# Patient Record
Sex: Female | Born: 1945 | Race: White | Hispanic: No | Marital: Married | Smoking: Current every day smoker
Health system: Southern US, Community
[De-identification: ages and names within clinical notes are randomized; demographics above are authoritative.]

## PROBLEM LIST (undated history)

## (undated) DIAGNOSIS — E7800 Pure hypercholesterolemia, unspecified: Secondary | ICD-10-CM

## (undated) DIAGNOSIS — E039 Hypothyroidism, unspecified: Secondary | ICD-10-CM

## (undated) DIAGNOSIS — K5792 Diverticulitis of intestine, part unspecified, without perforation or abscess without bleeding: Secondary | ICD-10-CM

## (undated) HISTORY — PX: HEMORRHOID SURGERY: SHX153

---

## 1997-04-15 ENCOUNTER — Ambulatory Visit (HOSPITAL_COMMUNITY): Admission: RE | Admit: 1997-04-15 | Discharge: 1997-04-15 | Payer: Self-pay | Admitting: Obstetrics and Gynecology

## 1998-04-08 ENCOUNTER — Encounter: Payer: Self-pay | Admitting: Obstetrics and Gynecology

## 1998-04-08 ENCOUNTER — Ambulatory Visit (HOSPITAL_COMMUNITY): Admission: RE | Admit: 1998-04-08 | Discharge: 1998-04-08 | Payer: Self-pay | Admitting: Obstetrics and Gynecology

## 1998-04-27 ENCOUNTER — Other Ambulatory Visit: Admission: RE | Admit: 1998-04-27 | Discharge: 1998-04-27 | Payer: Self-pay | Admitting: Obstetrics and Gynecology

## 1999-04-11 ENCOUNTER — Ambulatory Visit (HOSPITAL_COMMUNITY): Admission: RE | Admit: 1999-04-11 | Discharge: 1999-04-11 | Payer: Self-pay | Admitting: Gynecology

## 1999-04-11 ENCOUNTER — Encounter: Payer: Self-pay | Admitting: Gynecology

## 1999-05-03 ENCOUNTER — Other Ambulatory Visit: Admission: RE | Admit: 1999-05-03 | Discharge: 1999-05-03 | Payer: Self-pay | Admitting: Gynecology

## 2000-04-12 ENCOUNTER — Ambulatory Visit (HOSPITAL_COMMUNITY): Admission: RE | Admit: 2000-04-12 | Discharge: 2000-04-12 | Payer: Self-pay | Admitting: Gynecology

## 2000-04-12 ENCOUNTER — Encounter: Payer: Self-pay | Admitting: Gynecology

## 2000-05-10 ENCOUNTER — Other Ambulatory Visit: Admission: RE | Admit: 2000-05-10 | Discharge: 2000-05-10 | Payer: Self-pay | Admitting: Gynecology

## 2001-04-16 ENCOUNTER — Encounter: Payer: Self-pay | Admitting: Obstetrics and Gynecology

## 2001-04-16 ENCOUNTER — Ambulatory Visit (HOSPITAL_COMMUNITY): Admission: RE | Admit: 2001-04-16 | Discharge: 2001-04-16 | Payer: Self-pay | Admitting: Obstetrics and Gynecology

## 2001-05-06 ENCOUNTER — Other Ambulatory Visit: Admission: RE | Admit: 2001-05-06 | Discharge: 2001-05-06 | Payer: Self-pay | Admitting: Obstetrics and Gynecology

## 2002-04-28 ENCOUNTER — Ambulatory Visit (HOSPITAL_COMMUNITY): Admission: RE | Admit: 2002-04-28 | Discharge: 2002-04-28 | Payer: Self-pay | Admitting: Obstetrics and Gynecology

## 2002-04-28 ENCOUNTER — Encounter: Payer: Self-pay | Admitting: Obstetrics and Gynecology

## 2002-05-27 ENCOUNTER — Other Ambulatory Visit: Admission: RE | Admit: 2002-05-27 | Discharge: 2002-05-27 | Payer: Self-pay | Admitting: Obstetrics and Gynecology

## 2002-08-29 ENCOUNTER — Ambulatory Visit (HOSPITAL_COMMUNITY): Admission: RE | Admit: 2002-08-29 | Discharge: 2002-08-29 | Payer: Self-pay | Admitting: Gastroenterology

## 2003-08-21 ENCOUNTER — Other Ambulatory Visit: Admission: RE | Admit: 2003-08-21 | Discharge: 2003-08-21 | Payer: Self-pay | Admitting: Obstetrics and Gynecology

## 2004-09-14 ENCOUNTER — Other Ambulatory Visit: Admission: RE | Admit: 2004-09-14 | Discharge: 2004-09-14 | Payer: Self-pay | Admitting: Obstetrics and Gynecology

## 2005-03-14 ENCOUNTER — Ambulatory Visit (HOSPITAL_COMMUNITY): Admission: RE | Admit: 2005-03-14 | Discharge: 2005-03-14 | Payer: Self-pay | Admitting: Obstetrics and Gynecology

## 2006-03-15 ENCOUNTER — Ambulatory Visit (HOSPITAL_COMMUNITY): Admission: RE | Admit: 2006-03-15 | Discharge: 2006-03-15 | Payer: Self-pay | Admitting: Obstetrics and Gynecology

## 2006-04-03 ENCOUNTER — Encounter: Admission: RE | Admit: 2006-04-03 | Discharge: 2006-04-03 | Payer: Self-pay | Admitting: Obstetrics and Gynecology

## 2007-03-18 ENCOUNTER — Ambulatory Visit (HOSPITAL_COMMUNITY): Admission: RE | Admit: 2007-03-18 | Discharge: 2007-03-18 | Payer: Self-pay | Admitting: Obstetrics and Gynecology

## 2008-07-20 IMAGING — MG MM DIGITAL SCREENING BILAT
4 series · 4 of 4 positions shown · non-contrast
Comparison: none

DG SCREEN MAMMOGRAM BILATERAL
Bilateral CC and MLO view(s) were taken.

DIGITAL SCREENING MAMMOGRAM WITH CAD:
There is a dense fibroglandular pattern.  A possible mass is noted in the left breast.  Spot 
compression views and possibly sonography are recommended for further evaluation.  In the right 
breast, no masses or malignant type calcifications are identified.  Compared with prior studies.

[R CC]
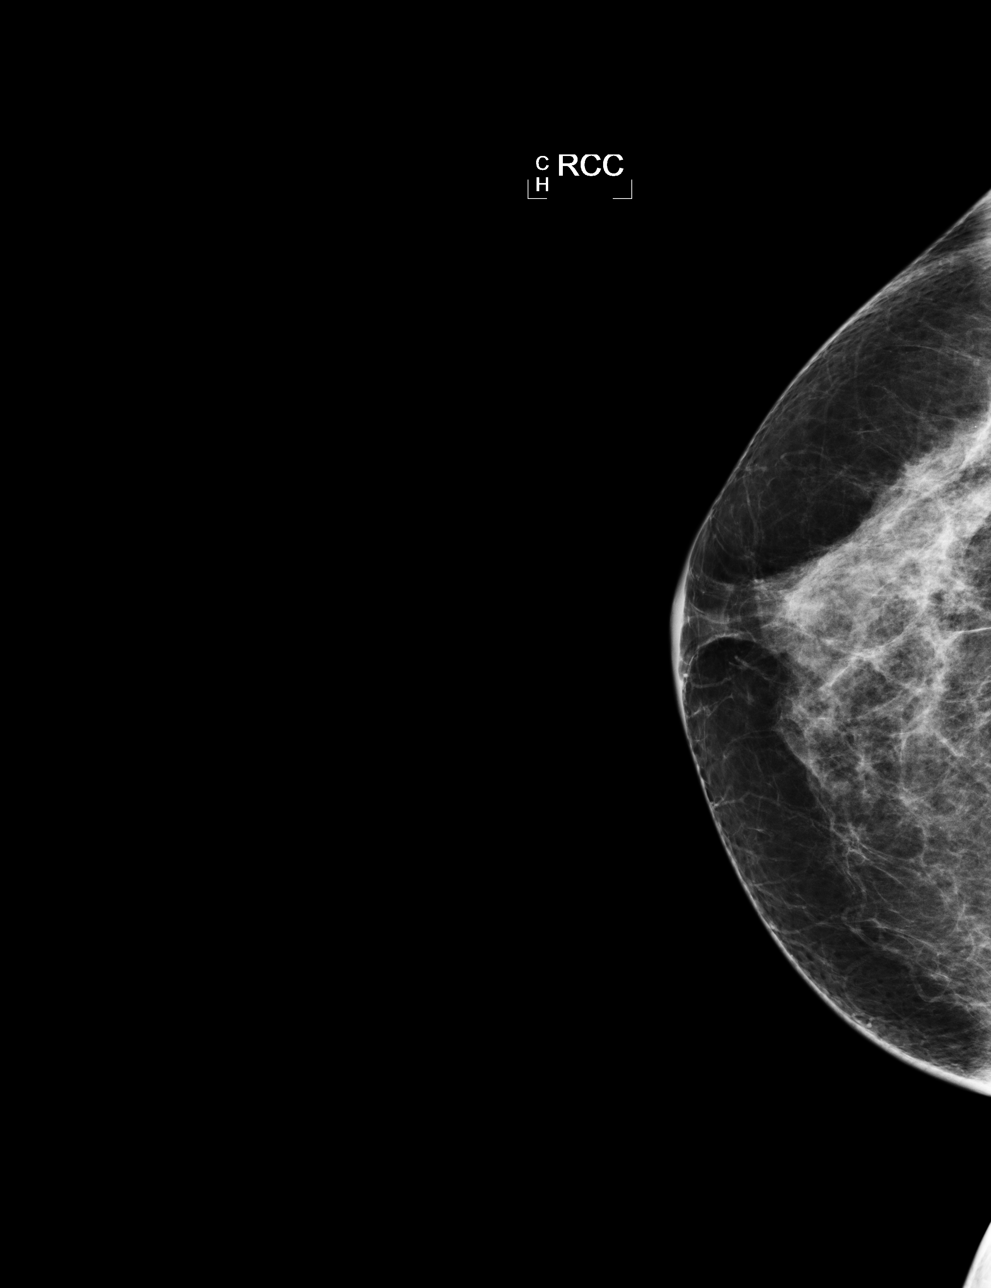

[R MLO]
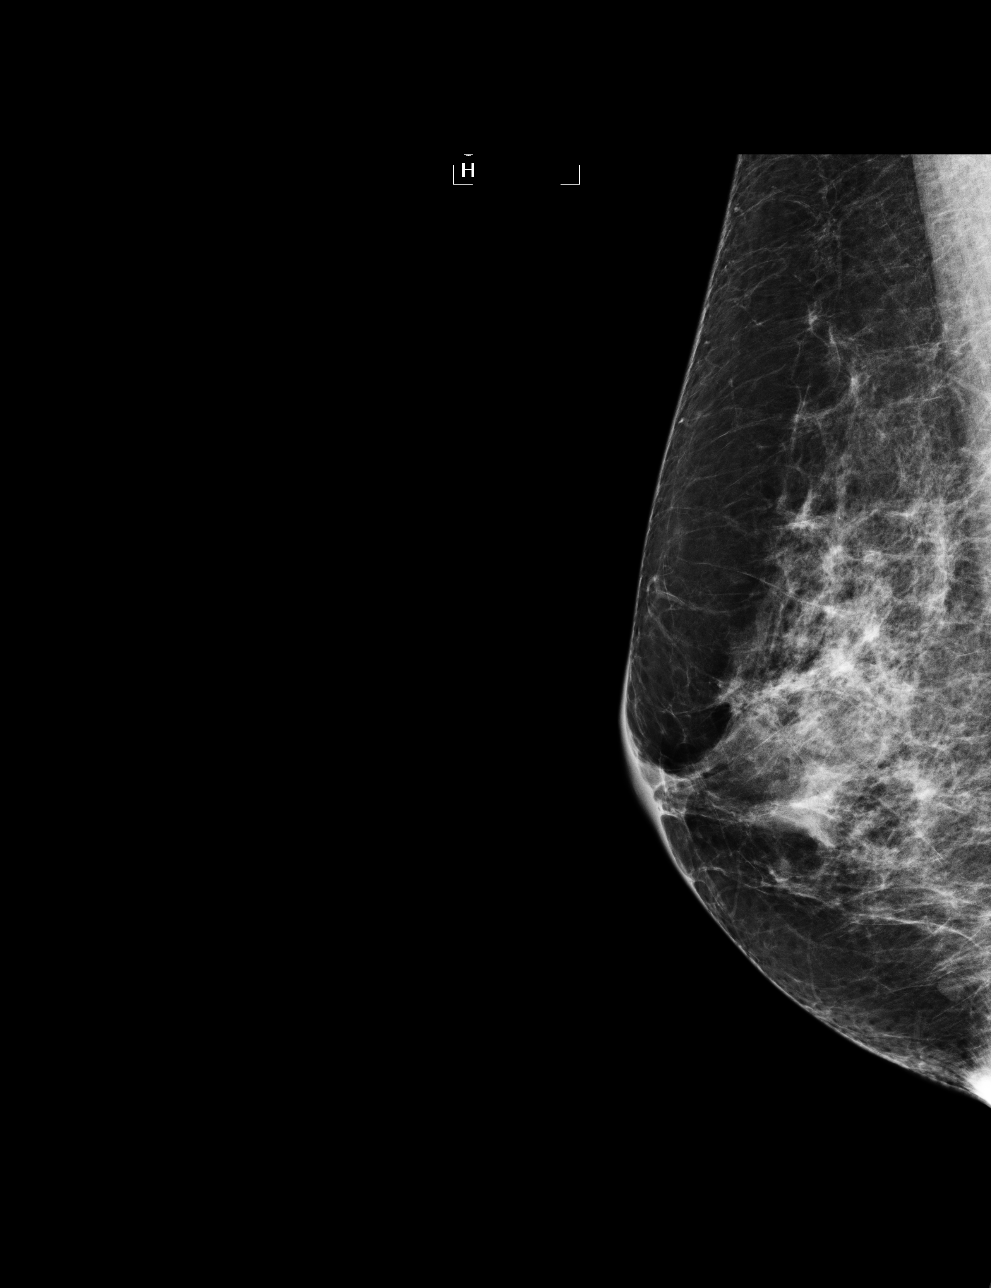

[L CC]
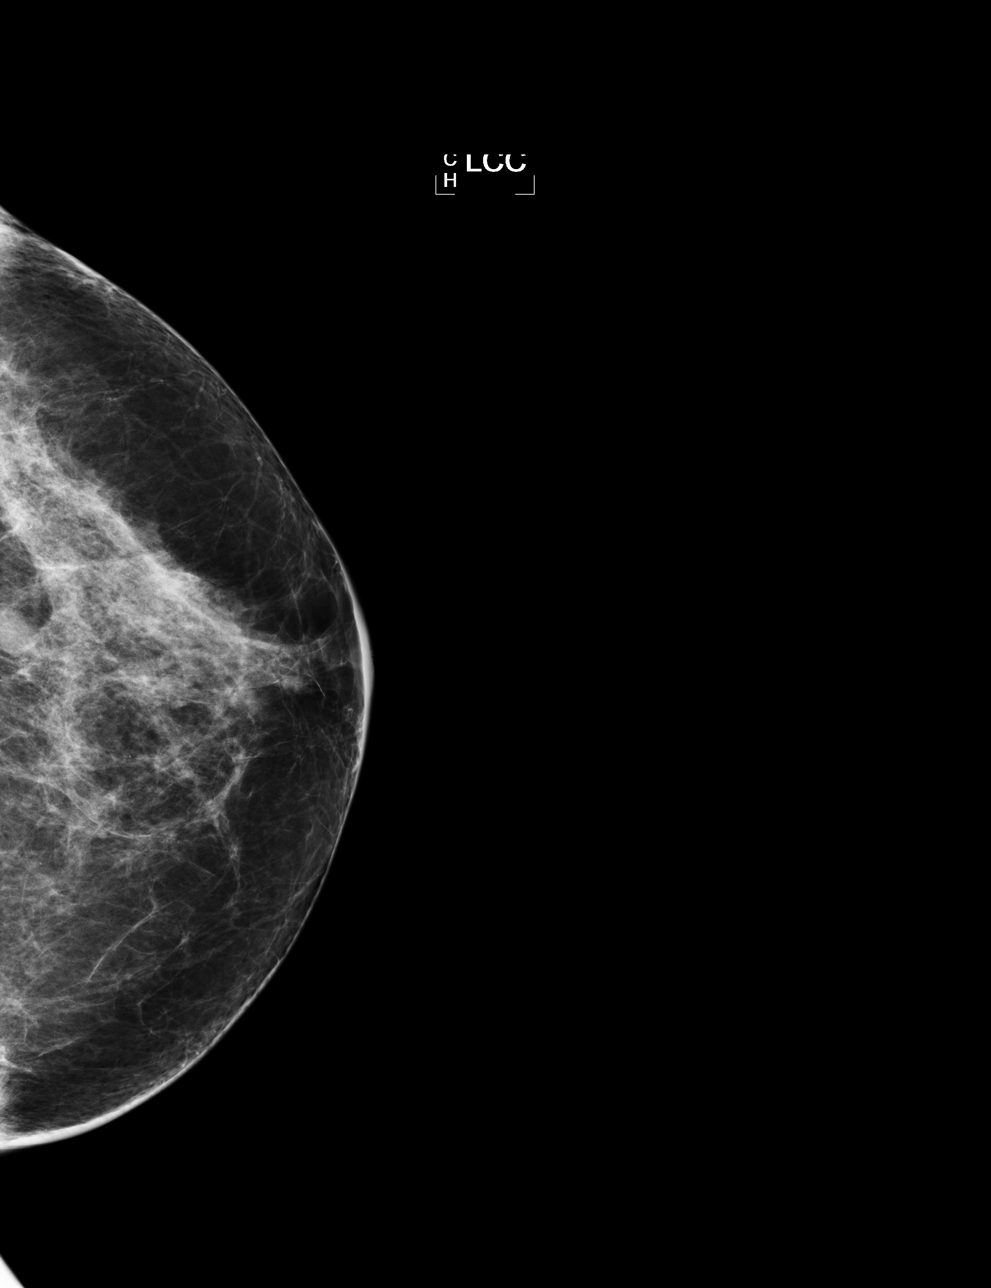

[L MLO]
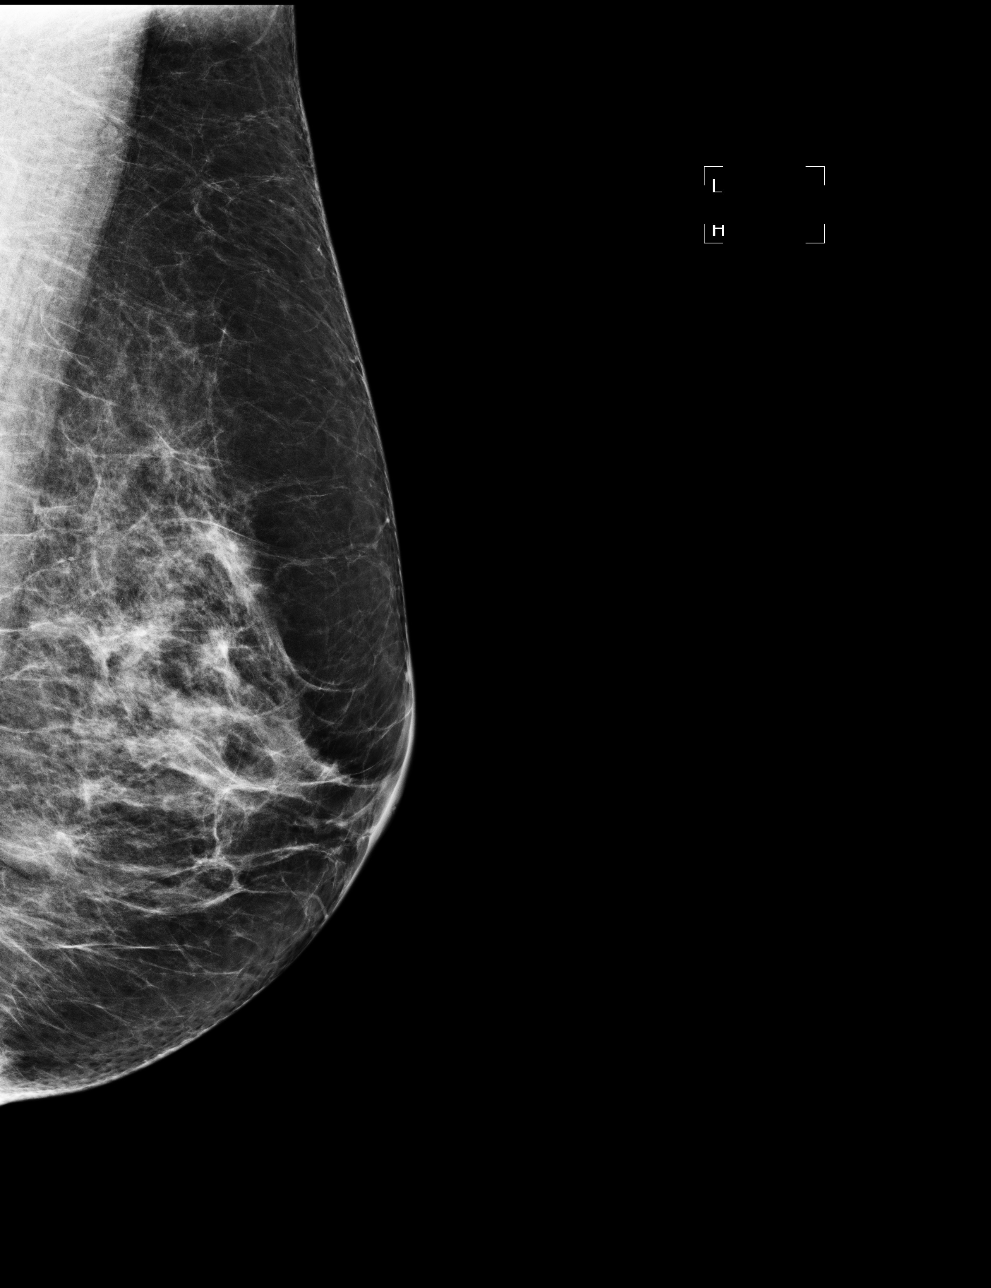

[4 of 4 positions shown; findings below may reference images not displayed]

IMPRESSION: Possible mass, left breast.  Additional evaluation is indicated.  The patient will be contacted for
additional studies and a supplementary report will follow.  No specific mammographic evidence of 
malignancy, right breast.

ASSESSMENT: Need additional imaging evaluation and/or prior mammograms for comparison - BI-RADS 0

Further imaging of the left breast.
ANALYZED BY COMPUTER AIDED DETECTION. , THIS PROCEDURE WAS A DIGITAL MAMMOGRAM.

## 2010-02-14 ENCOUNTER — Encounter: Payer: Self-pay | Admitting: Specialist

## 2010-06-10 NOTE — Op Note (Signed)
NAME:  Patricia Haney, Patricia Haney                    ACCOUNT NO.:  [id number]   MEDICAL RECORD NO.:  [id number]                   PATIENT TYPE:  AMB   LOCATION:  ENDO                                 FACILITY:  [facility]   PHYSICIAN:  Anselmo Rod, M.D.               DATE OF BIRTH:  1945/02/05   DATE OF PROCEDURE:  08/29/2002  DATE OF DISCHARGE:                                 OPERATIVE REPORT   PROCEDURE:  Screening colonoscopy.   ENDOSCOPIST:  Anselmo Rod, M.D.   INSTRUMENT USED:  Olympus video colonoscope.   INDICATIONS FOR PROCEDURE:  A 65 year old white female with a family history  of colon cancer and a recent  history of rectal bleeding with left lower  quadrant pain, rule out colonic polyps, masses, etc. The patient has a  questionable  history of diverticulitis in the past.   PREPROCEDURE PREPARATION:  Informed consent was obtained from the patient  and the patient was  fasted for 8 hours prior to  the procedure and prepped  with a bottle of magnesium citrate and a gallon of GoLYTELY the  night prior  to the procedure.   PREPROCEDURE PHYSICAL:  The patient had stable vital signs. Neck supple.  Chest clear to auscultation, normal S1, S2. Abdomen soft with normoactive  bowel sounds.   DESCRIPTION OF PROCEDURE:  The patient was placed in the left lateral  decubitus position and sedated with 90 mg of Demerol and 9 mg of Versed  intravenously. Once sedation was adequate, the patient was maintained on low  flow oxygen and continuous cardiac monitoring.   The Olympus video colonoscope was advanced into the rectum to the cecum.  There was some residual stool  in the colon. Multiple washings were done. No  masses, polyps, erosions or ulcerations or diverticula were seen.  The  appendiceal orifice and ileocecal valve were clearly visualized and  photographed. The terminal ileum appeared  normal and without lesions.  Retroflexion in the rectum revealed small nonbleeding internal  hemorrhoids.  There were a few left sided diverticula present. No masses or large polyps  were identified. The patient tolerated the procedure well without  complications.   IMPRESSION:  1. Small  nonbleeding internal hemorrhoids.  2. Left sided diverticulosis, early.  3. Normal appearing transverse colon, right colon, cecum and terminal ileum.   RECOMMENDATIONS:  1. A high fiber diet with liberal fluid intake has been recommended.  2. Repeat colorectal cancer screening is recommended in the next 5 years     considering the patient's family history     of colon cancer.  3. Outpatient follow up in the next  2 weeks for further recommendations  Anselmo Rod, M.D.    JNM/MEDQ  D:  08/29/2002  T:  08/30/2002  Job:  [id number]   cc:   Brooke Bonito, M.D.  [address]  [place]  [place] [address]  Fax: [phone]

## 2011-03-29 ENCOUNTER — Other Ambulatory Visit (HOSPITAL_COMMUNITY): Payer: Self-pay | Admitting: Obstetrics and Gynecology

## 2011-03-29 DIAGNOSIS — Z1231 Encounter for screening mammogram for malignant neoplasm of breast: Secondary | ICD-10-CM

## 2011-04-20 ENCOUNTER — Ambulatory Visit (HOSPITAL_COMMUNITY)
Admission: RE | Admit: 2011-04-20 | Discharge: 2011-04-20 | Disposition: A | Discharge status: Home / Self Care | Payer: Medicare Other | Source: Ambulatory Visit | Attending: Obstetrics and Gynecology | Admitting: Obstetrics and Gynecology

## 2011-04-20 DIAGNOSIS — Z1231 Encounter for screening mammogram for malignant neoplasm of breast: Secondary | ICD-10-CM | POA: Insufficient documentation

## 2011-10-05 ENCOUNTER — Ambulatory Visit: Payer: Medicare Other | Admitting: Family Medicine

## 2014-03-16 DIAGNOSIS — E785 Hyperlipidemia, unspecified: Secondary | ICD-10-CM | POA: Diagnosis not present

## 2014-03-16 DIAGNOSIS — M859 Disorder of bone density and structure, unspecified: Secondary | ICD-10-CM | POA: Diagnosis not present

## 2014-03-24 DIAGNOSIS — E785 Hyperlipidemia, unspecified: Secondary | ICD-10-CM | POA: Diagnosis not present

## 2014-03-24 DIAGNOSIS — H6120 Impacted cerumen, unspecified ear: Secondary | ICD-10-CM | POA: Diagnosis not present

## 2014-03-24 DIAGNOSIS — F17210 Nicotine dependence, cigarettes, uncomplicated: Secondary | ICD-10-CM | POA: Diagnosis not present

## 2014-03-24 DIAGNOSIS — M8580 Other specified disorders of bone density and structure, unspecified site: Secondary | ICD-10-CM | POA: Diagnosis not present

## 2014-03-24 DIAGNOSIS — E890 Postprocedural hypothyroidism: Secondary | ICD-10-CM | POA: Diagnosis not present

## 2014-06-03 ENCOUNTER — Other Ambulatory Visit (HOSPITAL_COMMUNITY): Payer: Self-pay | Admitting: Internal Medicine

## 2014-06-03 DIAGNOSIS — Z1231 Encounter for screening mammogram for malignant neoplasm of breast: Secondary | ICD-10-CM

## 2014-06-23 ENCOUNTER — Ambulatory Visit (HOSPITAL_COMMUNITY)
Admission: RE | Admit: 2014-06-23 | Discharge: 2014-06-23 | Disposition: A | Discharge status: Home / Self Care | Payer: Commercial Managed Care - HMO | Source: Ambulatory Visit | Attending: Internal Medicine | Admitting: Internal Medicine

## 2014-06-23 ENCOUNTER — Ambulatory Visit (HOSPITAL_COMMUNITY): Payer: Self-pay

## 2014-06-23 DIAGNOSIS — Z1231 Encounter for screening mammogram for malignant neoplasm of breast: Secondary | ICD-10-CM | POA: Diagnosis present

## 2014-07-06 DIAGNOSIS — H5202 Hypermetropia, left eye: Secondary | ICD-10-CM | POA: Diagnosis not present

## 2014-07-06 DIAGNOSIS — H5210 Myopia, unspecified eye: Secondary | ICD-10-CM | POA: Diagnosis not present

## 2014-07-07 DIAGNOSIS — H40013 Open angle with borderline findings, low risk, bilateral: Secondary | ICD-10-CM | POA: Diagnosis not present

## 2014-10-29 DIAGNOSIS — E785 Hyperlipidemia, unspecified: Secondary | ICD-10-CM | POA: Diagnosis not present

## 2014-10-29 DIAGNOSIS — M8580 Other specified disorders of bone density and structure, unspecified site: Secondary | ICD-10-CM | POA: Diagnosis not present

## 2014-11-09 DIAGNOSIS — Z1389 Encounter for screening for other disorder: Secondary | ICD-10-CM | POA: Diagnosis not present

## 2014-11-09 DIAGNOSIS — Z23 Encounter for immunization: Secondary | ICD-10-CM | POA: Diagnosis not present

## 2014-11-09 DIAGNOSIS — E785 Hyperlipidemia, unspecified: Secondary | ICD-10-CM | POA: Diagnosis not present

## 2014-11-09 DIAGNOSIS — M8580 Other specified disorders of bone density and structure, unspecified site: Secondary | ICD-10-CM | POA: Diagnosis not present

## 2014-11-09 DIAGNOSIS — N182 Chronic kidney disease, stage 2 (mild): Secondary | ICD-10-CM | POA: Diagnosis not present

## 2014-11-09 DIAGNOSIS — Z0000 Encounter for general adult medical examination without abnormal findings: Secondary | ICD-10-CM | POA: Diagnosis not present

## 2014-11-09 DIAGNOSIS — E890 Postprocedural hypothyroidism: Secondary | ICD-10-CM | POA: Diagnosis not present

## 2014-11-09 DIAGNOSIS — Z0001 Encounter for general adult medical examination with abnormal findings: Secondary | ICD-10-CM | POA: Diagnosis not present

## 2014-11-09 DIAGNOSIS — F17210 Nicotine dependence, cigarettes, uncomplicated: Secondary | ICD-10-CM | POA: Diagnosis not present

## 2015-04-13 DIAGNOSIS — H6690 Otitis media, unspecified, unspecified ear: Secondary | ICD-10-CM | POA: Diagnosis not present

## 2015-04-13 DIAGNOSIS — J329 Chronic sinusitis, unspecified: Secondary | ICD-10-CM | POA: Diagnosis not present

## 2015-05-03 DIAGNOSIS — M8580 Other specified disorders of bone density and structure, unspecified site: Secondary | ICD-10-CM | POA: Diagnosis not present

## 2015-05-03 DIAGNOSIS — E890 Postprocedural hypothyroidism: Secondary | ICD-10-CM | POA: Diagnosis not present

## 2015-05-03 DIAGNOSIS — E785 Hyperlipidemia, unspecified: Secondary | ICD-10-CM | POA: Diagnosis not present

## 2015-05-10 DIAGNOSIS — Z23 Encounter for immunization: Secondary | ICD-10-CM | POA: Diagnosis not present

## 2015-05-10 DIAGNOSIS — E890 Postprocedural hypothyroidism: Secondary | ICD-10-CM | POA: Diagnosis not present

## 2015-05-10 DIAGNOSIS — M8580 Other specified disorders of bone density and structure, unspecified site: Secondary | ICD-10-CM | POA: Diagnosis not present

## 2015-05-10 DIAGNOSIS — F17210 Nicotine dependence, cigarettes, uncomplicated: Secondary | ICD-10-CM | POA: Diagnosis not present

## 2015-05-10 DIAGNOSIS — N182 Chronic kidney disease, stage 2 (mild): Secondary | ICD-10-CM | POA: Diagnosis not present

## 2015-05-10 DIAGNOSIS — E785 Hyperlipidemia, unspecified: Secondary | ICD-10-CM | POA: Diagnosis not present

## 2015-06-01 DIAGNOSIS — Z1211 Encounter for screening for malignant neoplasm of colon: Secondary | ICD-10-CM | POA: Diagnosis not present

## 2015-06-01 DIAGNOSIS — Z1212 Encounter for screening for malignant neoplasm of rectum: Secondary | ICD-10-CM | POA: Diagnosis not present

## 2015-07-06 ENCOUNTER — Other Ambulatory Visit: Payer: Self-pay | Admitting: Internal Medicine

## 2015-07-06 ENCOUNTER — Other Ambulatory Visit: Payer: Self-pay

## 2015-07-06 DIAGNOSIS — Z1231 Encounter for screening mammogram for malignant neoplasm of breast: Secondary | ICD-10-CM

## 2015-07-16 ENCOUNTER — Ambulatory Visit
Admission: RE | Admit: 2015-07-16 | Discharge: 2015-07-16 | Disposition: A | Discharge status: Home / Self Care | Payer: Commercial Managed Care - HMO | Source: Ambulatory Visit | Attending: Internal Medicine | Admitting: Internal Medicine

## 2015-07-16 DIAGNOSIS — Z1231 Encounter for screening mammogram for malignant neoplasm of breast: Secondary | ICD-10-CM | POA: Diagnosis not present

## 2015-10-29 DIAGNOSIS — H40013 Open angle with borderline findings, low risk, bilateral: Secondary | ICD-10-CM | POA: Diagnosis not present

## 2015-11-02 DIAGNOSIS — Z1389 Encounter for screening for other disorder: Secondary | ICD-10-CM | POA: Diagnosis not present

## 2015-11-02 DIAGNOSIS — Z0000 Encounter for general adult medical examination without abnormal findings: Secondary | ICD-10-CM | POA: Diagnosis not present

## 2015-11-02 DIAGNOSIS — N390 Urinary tract infection, site not specified: Secondary | ICD-10-CM | POA: Diagnosis not present

## 2015-11-02 DIAGNOSIS — M8580 Other specified disorders of bone density and structure, unspecified site: Secondary | ICD-10-CM | POA: Diagnosis not present

## 2015-11-02 DIAGNOSIS — E785 Hyperlipidemia, unspecified: Secondary | ICD-10-CM | POA: Diagnosis not present

## 2015-11-02 DIAGNOSIS — E890 Postprocedural hypothyroidism: Secondary | ICD-10-CM | POA: Diagnosis not present

## 2015-11-09 DIAGNOSIS — F17210 Nicotine dependence, cigarettes, uncomplicated: Secondary | ICD-10-CM | POA: Diagnosis not present

## 2015-11-09 DIAGNOSIS — E890 Postprocedural hypothyroidism: Secondary | ICD-10-CM | POA: Diagnosis not present

## 2015-11-09 DIAGNOSIS — E785 Hyperlipidemia, unspecified: Secondary | ICD-10-CM | POA: Diagnosis not present

## 2015-11-09 DIAGNOSIS — M8580 Other specified disorders of bone density and structure, unspecified site: Secondary | ICD-10-CM | POA: Diagnosis not present

## 2015-11-09 DIAGNOSIS — N182 Chronic kidney disease, stage 2 (mild): Secondary | ICD-10-CM | POA: Diagnosis not present

## 2016-01-27 DIAGNOSIS — H40013 Open angle with borderline findings, low risk, bilateral: Secondary | ICD-10-CM | POA: Diagnosis not present

## 2016-01-27 DIAGNOSIS — H0520 Unspecified exophthalmos: Secondary | ICD-10-CM | POA: Diagnosis not present

## 2016-03-31 DIAGNOSIS — R0781 Pleurodynia: Secondary | ICD-10-CM | POA: Diagnosis not present

## 2016-03-31 DIAGNOSIS — S299XXA Unspecified injury of thorax, initial encounter: Secondary | ICD-10-CM | POA: Diagnosis not present

## 2016-03-31 DIAGNOSIS — R079 Chest pain, unspecified: Secondary | ICD-10-CM | POA: Diagnosis not present

## 2016-04-14 DIAGNOSIS — E559 Vitamin D deficiency, unspecified: Secondary | ICD-10-CM | POA: Diagnosis not present

## 2016-04-14 DIAGNOSIS — E785 Hyperlipidemia, unspecified: Secondary | ICD-10-CM | POA: Diagnosis not present

## 2016-04-14 DIAGNOSIS — M8580 Other specified disorders of bone density and structure, unspecified site: Secondary | ICD-10-CM | POA: Diagnosis not present

## 2016-06-05 ENCOUNTER — Other Ambulatory Visit: Payer: Self-pay | Admitting: Internal Medicine

## 2016-06-05 DIAGNOSIS — Z1231 Encounter for screening mammogram for malignant neoplasm of breast: Secondary | ICD-10-CM

## 2016-07-17 ENCOUNTER — Ambulatory Visit
Admission: RE | Admit: 2016-07-17 | Discharge: 2016-07-17 | Disposition: A | Discharge status: Home / Self Care | Payer: Commercial Managed Care - HMO | Source: Ambulatory Visit | Attending: Internal Medicine | Admitting: Internal Medicine

## 2016-07-17 DIAGNOSIS — Z1231 Encounter for screening mammogram for malignant neoplasm of breast: Secondary | ICD-10-CM | POA: Diagnosis not present

## 2016-10-26 DIAGNOSIS — Z0000 Encounter for general adult medical examination without abnormal findings: Secondary | ICD-10-CM | POA: Diagnosis not present

## 2016-10-26 DIAGNOSIS — E7800 Pure hypercholesterolemia, unspecified: Secondary | ICD-10-CM | POA: Diagnosis not present

## 2016-10-26 DIAGNOSIS — E559 Vitamin D deficiency, unspecified: Secondary | ICD-10-CM | POA: Diagnosis not present

## 2016-10-26 DIAGNOSIS — N390 Urinary tract infection, site not specified: Secondary | ICD-10-CM | POA: Diagnosis not present

## 2016-11-03 DIAGNOSIS — E7800 Pure hypercholesterolemia, unspecified: Secondary | ICD-10-CM | POA: Diagnosis not present

## 2016-11-03 DIAGNOSIS — Z0000 Encounter for general adult medical examination without abnormal findings: Secondary | ICD-10-CM | POA: Diagnosis not present

## 2016-11-03 DIAGNOSIS — M8580 Other specified disorders of bone density and structure, unspecified site: Secondary | ICD-10-CM | POA: Diagnosis not present

## 2016-11-03 DIAGNOSIS — E890 Postprocedural hypothyroidism: Secondary | ICD-10-CM | POA: Diagnosis not present

## 2016-11-03 DIAGNOSIS — N390 Urinary tract infection, site not specified: Secondary | ICD-10-CM | POA: Diagnosis not present

## 2016-11-29 DIAGNOSIS — H5211 Myopia, right eye: Secondary | ICD-10-CM | POA: Diagnosis not present

## 2016-12-05 DIAGNOSIS — E039 Hypothyroidism, unspecified: Secondary | ICD-10-CM | POA: Diagnosis not present

## 2016-12-05 DIAGNOSIS — Z23 Encounter for immunization: Secondary | ICD-10-CM | POA: Diagnosis not present

## 2016-12-05 DIAGNOSIS — N390 Urinary tract infection, site not specified: Secondary | ICD-10-CM | POA: Diagnosis not present

## 2016-12-05 DIAGNOSIS — Z780 Asymptomatic menopausal state: Secondary | ICD-10-CM | POA: Diagnosis not present

## 2016-12-05 DIAGNOSIS — R319 Hematuria, unspecified: Secondary | ICD-10-CM | POA: Diagnosis not present

## 2016-12-05 DIAGNOSIS — E7800 Pure hypercholesterolemia, unspecified: Secondary | ICD-10-CM | POA: Diagnosis not present

## 2016-12-25 DIAGNOSIS — R319 Hematuria, unspecified: Secondary | ICD-10-CM | POA: Diagnosis not present

## 2016-12-25 DIAGNOSIS — M79602 Pain in left arm: Secondary | ICD-10-CM | POA: Diagnosis not present

## 2016-12-25 DIAGNOSIS — M7989 Other specified soft tissue disorders: Secondary | ICD-10-CM | POA: Diagnosis not present

## 2016-12-25 DIAGNOSIS — N390 Urinary tract infection, site not specified: Secondary | ICD-10-CM | POA: Diagnosis not present

## 2016-12-25 DIAGNOSIS — M79632 Pain in left forearm: Secondary | ICD-10-CM | POA: Diagnosis not present

## 2017-03-28 DIAGNOSIS — H6983 Other specified disorders of Eustachian tube, bilateral: Secondary | ICD-10-CM | POA: Diagnosis not present

## 2017-03-28 DIAGNOSIS — H9319 Tinnitus, unspecified ear: Secondary | ICD-10-CM | POA: Diagnosis not present

## 2017-05-04 DIAGNOSIS — E7800 Pure hypercholesterolemia, unspecified: Secondary | ICD-10-CM | POA: Diagnosis not present

## 2017-06-08 ENCOUNTER — Other Ambulatory Visit: Payer: Self-pay | Admitting: Registered Nurse

## 2017-06-08 DIAGNOSIS — Z1231 Encounter for screening mammogram for malignant neoplasm of breast: Secondary | ICD-10-CM

## 2017-07-19 ENCOUNTER — Ambulatory Visit
Admission: RE | Admit: 2017-07-19 | Discharge: 2017-07-19 | Disposition: A | Discharge status: Home / Self Care | Payer: Medicare HMO | Source: Ambulatory Visit | Attending: Registered Nurse | Admitting: Registered Nurse

## 2017-07-19 DIAGNOSIS — Z1231 Encounter for screening mammogram for malignant neoplasm of breast: Secondary | ICD-10-CM

## 2017-10-17 DIAGNOSIS — H40013 Open angle with borderline findings, low risk, bilateral: Secondary | ICD-10-CM | POA: Diagnosis not present

## 2017-10-17 DIAGNOSIS — H0520 Unspecified exophthalmos: Secondary | ICD-10-CM | POA: Diagnosis not present

## 2017-10-17 DIAGNOSIS — H04129 Dry eye syndrome of unspecified lacrimal gland: Secondary | ICD-10-CM | POA: Diagnosis not present

## 2017-10-31 DIAGNOSIS — E890 Postprocedural hypothyroidism: Secondary | ICD-10-CM | POA: Diagnosis not present

## 2017-10-31 DIAGNOSIS — N182 Chronic kidney disease, stage 2 (mild): Secondary | ICD-10-CM | POA: Diagnosis not present

## 2017-10-31 DIAGNOSIS — E559 Vitamin D deficiency, unspecified: Secondary | ICD-10-CM | POA: Diagnosis not present

## 2017-10-31 DIAGNOSIS — E039 Hypothyroidism, unspecified: Secondary | ICD-10-CM | POA: Diagnosis not present

## 2017-11-05 DIAGNOSIS — Z1212 Encounter for screening for malignant neoplasm of rectum: Secondary | ICD-10-CM | POA: Diagnosis not present

## 2017-11-05 DIAGNOSIS — Z01419 Encounter for gynecological examination (general) (routine) without abnormal findings: Secondary | ICD-10-CM | POA: Diagnosis not present

## 2017-11-05 DIAGNOSIS — E7800 Pure hypercholesterolemia, unspecified: Secondary | ICD-10-CM | POA: Diagnosis not present

## 2017-11-05 DIAGNOSIS — F17210 Nicotine dependence, cigarettes, uncomplicated: Secondary | ICD-10-CM | POA: Diagnosis not present

## 2017-11-05 DIAGNOSIS — Z0000 Encounter for general adult medical examination without abnormal findings: Secondary | ICD-10-CM | POA: Diagnosis not present

## 2017-11-05 DIAGNOSIS — E039 Hypothyroidism, unspecified: Secondary | ICD-10-CM | POA: Diagnosis not present

## 2017-12-07 DIAGNOSIS — H524 Presbyopia: Secondary | ICD-10-CM | POA: Diagnosis not present

## 2018-04-01 DIAGNOSIS — L570 Actinic keratosis: Secondary | ICD-10-CM | POA: Diagnosis not present

## 2018-04-01 DIAGNOSIS — L821 Other seborrheic keratosis: Secondary | ICD-10-CM | POA: Diagnosis not present

## 2018-04-01 DIAGNOSIS — L820 Inflamed seborrheic keratosis: Secondary | ICD-10-CM | POA: Diagnosis not present

## 2018-04-01 DIAGNOSIS — L738 Other specified follicular disorders: Secondary | ICD-10-CM | POA: Diagnosis not present

## 2018-04-01 DIAGNOSIS — H61001 Unspecified perichondritis of right external ear: Secondary | ICD-10-CM | POA: Diagnosis not present

## 2018-04-01 DIAGNOSIS — H61002 Unspecified perichondritis of left external ear: Secondary | ICD-10-CM | POA: Diagnosis not present

## 2018-04-01 DIAGNOSIS — L918 Other hypertrophic disorders of the skin: Secondary | ICD-10-CM | POA: Diagnosis not present

## 2018-06-14 ENCOUNTER — Other Ambulatory Visit: Payer: Self-pay | Admitting: Registered Nurse

## 2018-06-14 DIAGNOSIS — Z1231 Encounter for screening mammogram for malignant neoplasm of breast: Secondary | ICD-10-CM

## 2018-08-02 ENCOUNTER — Ambulatory Visit: Payer: Medicare HMO

## 2018-08-27 ENCOUNTER — Other Ambulatory Visit: Payer: Self-pay

## 2018-08-27 ENCOUNTER — Ambulatory Visit
Admission: RE | Admit: 2018-08-27 | Discharge: 2018-08-27 | Disposition: A | Discharge status: Home / Self Care | Payer: Medicare HMO | Source: Ambulatory Visit | Attending: Registered Nurse | Admitting: Registered Nurse

## 2018-08-27 DIAGNOSIS — Z1231 Encounter for screening mammogram for malignant neoplasm of breast: Secondary | ICD-10-CM | POA: Diagnosis not present

## 2018-11-04 DIAGNOSIS — E559 Vitamin D deficiency, unspecified: Secondary | ICD-10-CM | POA: Diagnosis not present

## 2018-11-04 DIAGNOSIS — M8580 Other specified disorders of bone density and structure, unspecified site: Secondary | ICD-10-CM | POA: Diagnosis not present

## 2018-11-04 DIAGNOSIS — E7800 Pure hypercholesterolemia, unspecified: Secondary | ICD-10-CM | POA: Diagnosis not present

## 2018-11-04 DIAGNOSIS — E785 Hyperlipidemia, unspecified: Secondary | ICD-10-CM | POA: Diagnosis not present

## 2018-11-04 DIAGNOSIS — E890 Postprocedural hypothyroidism: Secondary | ICD-10-CM | POA: Diagnosis not present

## 2018-11-11 DIAGNOSIS — Z0000 Encounter for general adult medical examination without abnormal findings: Secondary | ICD-10-CM | POA: Diagnosis not present

## 2018-11-11 DIAGNOSIS — Z1212 Encounter for screening for malignant neoplasm of rectum: Secondary | ICD-10-CM | POA: Diagnosis not present

## 2018-11-11 DIAGNOSIS — E7800 Pure hypercholesterolemia, unspecified: Secondary | ICD-10-CM | POA: Diagnosis not present

## 2018-11-11 DIAGNOSIS — E785 Hyperlipidemia, unspecified: Secondary | ICD-10-CM | POA: Diagnosis not present

## 2018-11-11 DIAGNOSIS — F17210 Nicotine dependence, cigarettes, uncomplicated: Secondary | ICD-10-CM | POA: Diagnosis not present

## 2018-11-11 DIAGNOSIS — Z01419 Encounter for gynecological examination (general) (routine) without abnormal findings: Secondary | ICD-10-CM | POA: Diagnosis not present

## 2018-11-11 DIAGNOSIS — E890 Postprocedural hypothyroidism: Secondary | ICD-10-CM | POA: Diagnosis not present

## 2018-11-22 DIAGNOSIS — H2513 Age-related nuclear cataract, bilateral: Secondary | ICD-10-CM | POA: Diagnosis not present

## 2018-11-22 DIAGNOSIS — H40013 Open angle with borderline findings, low risk, bilateral: Secondary | ICD-10-CM | POA: Diagnosis not present

## 2018-11-22 DIAGNOSIS — H02401 Unspecified ptosis of right eyelid: Secondary | ICD-10-CM | POA: Diagnosis not present

## 2018-12-09 DIAGNOSIS — Z1211 Encounter for screening for malignant neoplasm of colon: Secondary | ICD-10-CM | POA: Diagnosis not present

## 2018-12-09 DIAGNOSIS — Z1212 Encounter for screening for malignant neoplasm of rectum: Secondary | ICD-10-CM | POA: Diagnosis not present

## 2019-02-04 DIAGNOSIS — N390 Urinary tract infection, site not specified: Secondary | ICD-10-CM | POA: Diagnosis not present

## 2019-03-09 ENCOUNTER — Ambulatory Visit: Payer: Medicare HMO | Attending: Internal Medicine

## 2019-03-09 DIAGNOSIS — Z23 Encounter for immunization: Secondary | ICD-10-CM

## 2019-03-09 NOTE — Progress Notes (Signed)
   Covid-19 Vaccination Clinic  Name:  Patricia Haney    MRN: [id number] DOB: 1945-06-03  03/09/2019  [name] was observed post Covid-19 immunization for 15 minutes without incidence. She was provided with Vaccine Information Sheet and instruction to access the V-Safe system.   [name] was instructed to call 911 with any severe reactions post vaccine: Marland Kitchen Difficulty breathing  . Swelling of your face and throat  . A fast heartbeat  . A bad rash all over your body  . Dizziness and weakness    Immunizations Administered    Name Date Dose VIS Date Route   Pfizer COVID-19 Vaccine 03/09/2019  8:17 AM 0.3 mL 01/03/2019 Intramuscular   Manufacturer: [place]   Lot: [id number]   [place]: [id number]

## 2019-04-01 ENCOUNTER — Ambulatory Visit: Payer: Medicare HMO | Attending: Internal Medicine

## 2019-04-01 DIAGNOSIS — Z23 Encounter for immunization: Secondary | ICD-10-CM | POA: Insufficient documentation

## 2019-04-01 NOTE — Progress Notes (Signed)
   Covid-19 Vaccination Clinic  Name:  Patricia Haney    MRN: [id number] DOB: December 03, 1945  04/01/2019  [name] was observed post Covid-19 immunization for 15 minutes without incident. She was provided with Vaccine Information Sheet and instruction to access the V-Safe system.   [name] was instructed to call 911 with any severe reactions post vaccine: Marland Kitchen Difficulty breathing  . Swelling of face and throat  . A fast heartbeat  . A bad rash all over body  . Dizziness and weakness   Immunizations Administered    Name Date Dose VIS Date Route   Pfizer COVID-19 Vaccine 04/01/2019  8:20 AM 0.3 mL 01/03/2019 Intramuscular   Manufacturer: [place]   Lot: [id number]   [place]: [id number]

## 2019-04-09 DIAGNOSIS — R300 Dysuria: Secondary | ICD-10-CM | POA: Diagnosis not present

## 2019-04-12 ENCOUNTER — Other Ambulatory Visit: Payer: Self-pay

## 2019-04-12 ENCOUNTER — Encounter (HOSPITAL_COMMUNITY): Payer: Self-pay | Admitting: Emergency Medicine

## 2019-04-12 ENCOUNTER — Emergency Department (HOSPITAL_COMMUNITY): Payer: Medicare HMO

## 2019-04-12 ENCOUNTER — Emergency Department (HOSPITAL_COMMUNITY)
Admission: EM | Admit: 2019-04-12 | Discharge: 2019-04-12 | Disposition: A | Discharge status: Home / Self Care | Payer: Medicare HMO | Attending: Emergency Medicine | Admitting: Emergency Medicine

## 2019-04-12 DIAGNOSIS — R079 Chest pain, unspecified: Secondary | ICD-10-CM | POA: Insufficient documentation

## 2019-04-12 DIAGNOSIS — R1013 Epigastric pain: Secondary | ICD-10-CM | POA: Diagnosis not present

## 2019-04-12 DIAGNOSIS — R918 Other nonspecific abnormal finding of lung field: Secondary | ICD-10-CM | POA: Diagnosis not present

## 2019-04-12 DIAGNOSIS — R0789 Other chest pain: Secondary | ICD-10-CM | POA: Diagnosis not present

## 2019-04-12 DIAGNOSIS — F17200 Nicotine dependence, unspecified, uncomplicated: Secondary | ICD-10-CM | POA: Diagnosis not present

## 2019-04-12 DIAGNOSIS — R110 Nausea: Secondary | ICD-10-CM | POA: Insufficient documentation

## 2019-04-12 HISTORY — DX: Hypothyroidism, unspecified: E03.9

## 2019-04-12 HISTORY — DX: Pure hypercholesterolemia, unspecified: E78.00

## 2019-04-12 LAB — CBC
HCT: 43.7 % (ref 36.0–46.0)
Hemoglobin: 14.2 g/dL (ref 12.0–15.0)
MCH: 31.3 pg (ref 26.0–34.0)
MCHC: 32.5 g/dL (ref 30.0–36.0)
MCV: 96.5 fL (ref 80.0–100.0)
Platelets: 209 10*3/uL (ref 150–400)
RBC: 4.53 MIL/uL (ref 3.87–5.11)
RDW: 13.2 % (ref 11.5–15.5)
WBC: 8.6 10*3/uL (ref 4.0–10.5)
nRBC: 0 % (ref 0.0–0.2)

## 2019-04-12 LAB — BASIC METABOLIC PANEL
Anion gap: 11 (ref 5–15)
BUN: 9 mg/dL (ref 8–23)
CO2: 26 mmol/L (ref 22–32)
Calcium: 9.6 mg/dL (ref 8.9–10.3)
Chloride: 101 mmol/L (ref 98–111)
Creatinine, Ser: 0.81 mg/dL (ref 0.44–1.00)
GFR calc Af Amer: >60 mL/min (ref >60)
GFR calc non Af Amer: >60 mL/min (ref >60)
Glucose, Bld: 149 mg/dL — ABNORMAL HIGH (ref 70–99)
Potassium: 3.5 mmol/L (ref 3.5–5.1)
Sodium: 138 mmol/L (ref 135–145)

## 2019-04-12 LAB — TROPONIN I (HIGH SENSITIVITY): Troponin I (High Sensitivity): 3 ng/L (ref <18)

## 2019-04-12 LAB — CBG MONITORING, ED: Glucose-Capillary: 141 mg/dL — ABNORMAL HIGH (ref 70–99)

## 2019-04-12 MED ORDER — FAMOTIDINE 20 MG PO TABS: 20.0000 mg | ORAL_TABLET | Freq: Two times a day (BID) | ORAL | 0 refills | Status: DC

## 2019-04-12 MED ORDER — SODIUM CHLORIDE 0.9% FLUSH: 3.0000 mL | Freq: Once | INTRAVENOUS | Status: DC

## 2019-04-12 NOTE — ED Triage Notes (Signed)
C/o "discomfort" to center of chest since yesterday afternoon.  Denies any other associated symptoms.

## 2019-04-12 NOTE — ED Provider Notes (Signed)
[facility] EMERGENCY DEPARTMENT Provider Note   CSN: [id number] Arrival date & time: 04/12/19  0932     History Chief Complaint  Patient presents with  . Chest Pain    Patricia Haney is a 74 y.o. female with a history of high cholesterol, smoking 50 years, reflux, presented emerged prior chest pain.  Patient reports that she was started on Macrobid new antibiotic for possible UTI yesterday.  She states she took her first pill in the morning.  She also ate a bagel for breakfast, then ate a potato sandwich for lunch.  Soon after eating lunch she began having burning pressure and discomfort in her epigastrium.  She says it felt like her typical heartburn but much more intense.  She took several Tums yesterday evening and did achieve some momentary relief with that, but the pain would return afterwards.  She went to bed last night woke up this morning with continued pain in her chest.  She was unable to eat much this morning as she had little appetite.  She denies any radiation of her pain down her arms.  She denies any lightheadedness, vomiting or diaphoresis.  She currently has no pain during my exam in the room  She has no history of MI or cardiac stents.  She has no history of angina.  She is never seen a cardiologist.  She denies a history of high blood pressure or diabetes.  She has smoked half a pack to a full pack a day for nearly 50 years.  She also has high cholesterol.  She has no significant family history of MI.  No hemoptysis or asymmetric LE edema. Patient denies personal or family history of DVT or PE. No recent hormone use (including OCP); travel for >6 hours; prolonged immobilization for greater than 3 days; surgeries or trauma in the last 4 weeks; or malignancy with treatment within 6 months.  No fevers, chills, coughing this week.  HPI     Past Medical History:  Diagnosis Date  . High cholesterol   . Hypothyroid     There are no problems to  display for this patient.   History reviewed. No pertinent surgical history.   OB History   No obstetric history on file.     No family history on file.  Social History   Tobacco Use  . Smoking status: Current Every Day Smoker  . Smokeless tobacco: Never Used  Substance Use Topics  . Alcohol use: Not Currently  . Drug use: Never    Home Medications Prior to Admission medications   Medication Sig Start Date End Date Taking? Authorizing Provider  famotidine (PEPCID) 20 MG tablet Take 1 tablet (20 mg total) by mouth 2 (two) times daily. 04/12/19 05/12/19  Wyvonnia Dusky, MD    Allergies    Patient has no allergy information on record.  Review of Systems   Review of Systems  Constitutional: Negative for chills and fever.  Eyes: Negative for pain.  Respiratory: Negative for cough and shortness of breath.   Cardiovascular: Positive for chest pain. Negative for palpitations.  Gastrointestinal: Positive for nausea. Negative for abdominal pain and vomiting.  Musculoskeletal: Negative for arthralgias and back pain.  Neurological: Negative for syncope and light-headedness.  Psychiatric/Behavioral: Negative for agitation and confusion.  All other systems reviewed and are negative.   Physical Exam Updated Vital Signs BP (!) 144/68 (BP Location: Left Arm)   Pulse 74   Temp 97.9 F (36.6 C)  Resp 16   Ht 5\' 4"  (1.626 m)   Wt 59 kg   SpO2 100%   BMI 22.31 kg/m   Physical Exam Vitals and nursing note reviewed.  Constitutional:      General: She is not in acute distress.    Appearance: She is well-developed.  HENT:     Head: Normocephalic and atraumatic.  Eyes:     Conjunctiva/sclera: Conjunctivae normal.  Cardiovascular:     Rate and Rhythm: Normal rate and regular rhythm.     Heart sounds: No murmur.  Pulmonary:     Effort: Pulmonary effort is normal. No respiratory distress.     Breath sounds: Normal breath sounds.  Abdominal:     Palpations: Abdomen is  soft.     Tenderness: There is no abdominal tenderness.  Musculoskeletal:     Cervical back: Neck supple.  Skin:    General: Skin is warm and dry.  Neurological:     Mental Status: She is alert.     ED Results / Procedures / Treatments   Labs (all labs ordered are listed, but only abnormal results are displayed) Labs Reviewed  BASIC METABOLIC PANEL - Abnormal; Notable for the following components:      Result Value   Glucose, Bld 149 (*)    All other components within normal limits  CBG MONITORING, ED - Abnormal; Notable for the following components:   Glucose-Capillary 141 (*)    All other components within normal limits  CBC  TROPONIN I (HIGH SENSITIVITY)    EKG EKG Interpretation  Date/Time:  Saturday April 12 2019 09:33:16 EDT Ventricular Rate:  80 PR Interval:  142 QRS Duration: 86 QT Interval:  390 QTC Calculation: 449 R Axis:   82 Text Interpretation: Normal sinus rhythm Normal ECG No STEMI Confirmed by Octaviano Glow [phone]) on 04/12/2019 9:43:58 AM   Radiology DG Chest 2 View  Result Date: 04/12/2019 CLINICAL DATA:  Discomfort. EXAM: CHEST - 2 VIEW COMPARISON:  None. FINDINGS: Hyperinflation of the lungs. The heart, hila, and mediastinum are unremarkable. No pulmonary nodules or mass. No focal infiltrates. No other acute abnormalities. IMPRESSION: Hyperinflation of the lungs consistent with COPD or emphysema. No other acute abnormalities are identified. Electronically Signed   By: Dorise Bullion III M.D   On: 04/12/2019 11:11    Procedures Procedures (including critical care time)  Medications Ordered in ED Medications - No data to display  ED Course  I have reviewed the triage vital signs and the nursing notes.  Pertinent labs & imaging results that were available during my care of the patient were reviewed by me and considered in my medical decision making (see chart for details).  74 yo female w/ smoking hx presenting to ED with epigastric burning  pain that began yesterday after eating a bagel and potato sandwich, improved with tums yesterday evening, but returned upon awakening this morning.  Her symptoms have improved even since arriving in the ED, and she tells me on my re-evaluation that her pain is completely gone.  This history and exam are strongly consistent with reflux / heartburn related.  She does have a hx of heartburn.  We'll start her on pepcid here.  She is pain free.  This is less likely ACS given her pain began after eating, and occurred while at rest, and that she has no prior hx of angina.  I did advise her to follow up with a cardiologist regardless, as she has never had a cardiac evaluation, and  she does have an extensive smoking hx & HLD, which are risk factors for CAD.  She may need a single office evaluation or possible stress testing as an outpatient.  We also discussed return precautions, and she verbalized understanding.  Based on her exam and workup, I have very low suspicion for PE, PNA, PTX, biliary disease, or acute intraabdominal emergency.  Clinical Course as of Apr 12 1818  Sat Apr 12, 2019  1100 BP 125/73 while I was in the room, suspect initial reading was erroneous, she has no hx of HTN   [MT]    Clinical Course User Index [MT] Briceida Rasberry, Carola Rhine, MD    Final Clinical Impression(s) / ED Diagnoses Final diagnoses:  Chest pain, unspecified type    Rx / DC Orders ED Discharge Orders         Ordered    famotidine (PEPCID) 20 MG tablet  2 times daily     04/12/19 1118           Alaysia Lightle, Carola Rhine, MD 04/12/19 1820

## 2019-04-12 NOTE — Discharge Instructions (Signed)
You were seen in our emergency department for an episode of chest pain that began yesterday and continued into this morning.  Based on your history, your EKG, and your medical work-up, I suspect this was likely an episode of reflux.  I prescribed you Pepcid to take for a few days.  Also felt it was reasonable for you to stop taking your antibiotic until your doctor is able to confirm that you do in fact have a urinary infection.  It is possible you are experiencing side effects of antibiotic.  I would recommend that you follow-up with a cardiologist all the same.  Based on your age, your smoking history, and your cholesterol, you do have risk factors for heart disease.  You should be evaluated at least once by a cardiologist.  Please call their office and arrange for an appointment in the next week.   If you feel like your pain is returning and severe, or you begin to feel lightheaded or shortness of breath, please return to the emergency department immediately.

## 2019-05-07 DIAGNOSIS — E7800 Pure hypercholesterolemia, unspecified: Secondary | ICD-10-CM | POA: Diagnosis not present

## 2019-05-12 DIAGNOSIS — E785 Hyperlipidemia, unspecified: Secondary | ICD-10-CM | POA: Diagnosis not present

## 2019-05-12 DIAGNOSIS — E039 Hypothyroidism, unspecified: Secondary | ICD-10-CM | POA: Diagnosis not present

## 2019-05-20 DIAGNOSIS — L723 Sebaceous cyst: Secondary | ICD-10-CM | POA: Diagnosis not present

## 2019-05-20 DIAGNOSIS — L821 Other seborrheic keratosis: Secondary | ICD-10-CM | POA: Diagnosis not present

## 2019-05-20 DIAGNOSIS — D235 Other benign neoplasm of skin of trunk: Secondary | ICD-10-CM | POA: Diagnosis not present

## 2019-05-20 DIAGNOSIS — D225 Melanocytic nevi of trunk: Secondary | ICD-10-CM | POA: Diagnosis not present

## 2019-08-13 ENCOUNTER — Other Ambulatory Visit: Payer: Self-pay | Admitting: Registered Nurse

## 2019-08-13 DIAGNOSIS — Z1231 Encounter for screening mammogram for malignant neoplasm of breast: Secondary | ICD-10-CM

## 2019-08-28 ENCOUNTER — Ambulatory Visit
Admission: RE | Admit: 2019-08-28 | Discharge: 2019-08-28 | Disposition: A | Discharge status: Home / Self Care | Payer: Medicare HMO | Source: Ambulatory Visit | Attending: Registered Nurse | Admitting: Registered Nurse

## 2019-08-28 ENCOUNTER — Other Ambulatory Visit: Payer: Self-pay

## 2019-08-28 DIAGNOSIS — Z1231 Encounter for screening mammogram for malignant neoplasm of breast: Secondary | ICD-10-CM

## 2019-11-05 DIAGNOSIS — E039 Hypothyroidism, unspecified: Secondary | ICD-10-CM | POA: Diagnosis not present

## 2019-11-05 DIAGNOSIS — M8580 Other specified disorders of bone density and structure, unspecified site: Secondary | ICD-10-CM | POA: Diagnosis not present

## 2019-11-05 DIAGNOSIS — E785 Hyperlipidemia, unspecified: Secondary | ICD-10-CM | POA: Diagnosis not present

## 2019-11-05 DIAGNOSIS — E7800 Pure hypercholesterolemia, unspecified: Secondary | ICD-10-CM | POA: Diagnosis not present

## 2019-11-05 DIAGNOSIS — E559 Vitamin D deficiency, unspecified: Secondary | ICD-10-CM | POA: Diagnosis not present

## 2019-11-10 DIAGNOSIS — E559 Vitamin D deficiency, unspecified: Secondary | ICD-10-CM | POA: Diagnosis not present

## 2019-11-10 DIAGNOSIS — Z23 Encounter for immunization: Secondary | ICD-10-CM | POA: Diagnosis not present

## 2019-11-10 DIAGNOSIS — E785 Hyperlipidemia, unspecified: Secondary | ICD-10-CM | POA: Diagnosis not present

## 2019-11-10 DIAGNOSIS — E890 Postprocedural hypothyroidism: Secondary | ICD-10-CM | POA: Diagnosis not present

## 2019-11-10 DIAGNOSIS — F17210 Nicotine dependence, cigarettes, uncomplicated: Secondary | ICD-10-CM | POA: Diagnosis not present

## 2019-11-10 DIAGNOSIS — Z0000 Encounter for general adult medical examination without abnormal findings: Secondary | ICD-10-CM | POA: Diagnosis not present

## 2020-01-13 DIAGNOSIS — E890 Postprocedural hypothyroidism: Secondary | ICD-10-CM | POA: Diagnosis not present

## 2020-01-13 DIAGNOSIS — R300 Dysuria: Secondary | ICD-10-CM | POA: Diagnosis not present

## 2020-01-13 DIAGNOSIS — E559 Vitamin D deficiency, unspecified: Secondary | ICD-10-CM | POA: Diagnosis not present

## 2020-01-29 DIAGNOSIS — R300 Dysuria: Secondary | ICD-10-CM | POA: Diagnosis not present

## 2020-03-10 DIAGNOSIS — E890 Postprocedural hypothyroidism: Secondary | ICD-10-CM | POA: Diagnosis not present

## 2020-04-07 DIAGNOSIS — H16142 Punctate keratitis, left eye: Secondary | ICD-10-CM | POA: Diagnosis not present

## 2020-05-26 DIAGNOSIS — E890 Postprocedural hypothyroidism: Secondary | ICD-10-CM | POA: Diagnosis not present

## 2020-06-01 DIAGNOSIS — L918 Other hypertrophic disorders of the skin: Secondary | ICD-10-CM | POA: Diagnosis not present

## 2020-06-01 DIAGNOSIS — D225 Melanocytic nevi of trunk: Secondary | ICD-10-CM | POA: Diagnosis not present

## 2020-06-01 DIAGNOSIS — D1801 Hemangioma of skin and subcutaneous tissue: Secondary | ICD-10-CM | POA: Diagnosis not present

## 2020-06-01 DIAGNOSIS — L821 Other seborrheic keratosis: Secondary | ICD-10-CM | POA: Diagnosis not present

## 2020-06-01 DIAGNOSIS — D485 Neoplasm of uncertain behavior of skin: Secondary | ICD-10-CM | POA: Diagnosis not present

## 2020-06-01 DIAGNOSIS — L723 Sebaceous cyst: Secondary | ICD-10-CM | POA: Diagnosis not present

## 2020-07-27 ENCOUNTER — Other Ambulatory Visit: Payer: Self-pay | Admitting: Registered Nurse

## 2020-07-27 DIAGNOSIS — Z1231 Encounter for screening mammogram for malignant neoplasm of breast: Secondary | ICD-10-CM

## 2020-09-07 DIAGNOSIS — K921 Melena: Secondary | ICD-10-CM | POA: Diagnosis not present

## 2020-09-16 ENCOUNTER — Ambulatory Visit: Payer: Medicare HMO

## 2020-10-05 ENCOUNTER — Telehealth: Payer: Self-pay | Admitting: Internal Medicine

## 2020-10-05 NOTE — Telephone Encounter (Signed)
Hi Dr. Henrene Pastor,  We received a referral for patient to have  a repeat colonoscopy. Patient had one done in 2004 with Dr. Collene Mares, requesting for you to review and advise on scheduling.    Thanks

## 2020-10-14 NOTE — Telephone Encounter (Signed)
Scheduled OV 10/27 with Dr. Henrene Pastor

## 2020-10-14 NOTE — Telephone Encounter (Signed)
Okay.  She needs a routine office follow-up with any provider

## 2020-10-14 NOTE — Telephone Encounter (Signed)
Dr. Henrene Pastor, I do apologize. After reviewing your recommendations I went back to the referral and realized it does include the patient having blood in stool and rectal bleeding as her main GI concern at this time.

## 2020-10-14 NOTE — Telephone Encounter (Signed)
GI RECORDS REVIEW  I was provided with records for review regarding a referral for this patient to have a repeat colonoscopy.  I found the following:  1.  Colonoscopy with Dr. Collene Mares August 29, 2002 revealed left-sided diverticulosis and hemorrhoids.  Otherwise normal. 2.  Negative Cologuard testing November 2020.  Not sure why she is being referred for colonoscopy if she had negative Cologuard testing November 2020.  I will asked my nurse to contact the patient and/or the regarding the nature of the request for colonoscopy (are there signs, symptoms, laboratory abnormalities, or other concerns?).  Final recommendations thereafter.  Docia Chuck. Geri [place]in[place]., M.D. [facility] Division of Gastroenterology   [place], as above.  Thanks.

## 2020-10-25 ENCOUNTER — Other Ambulatory Visit: Payer: Self-pay

## 2020-10-25 ENCOUNTER — Ambulatory Visit
Admission: RE | Admit: 2020-10-25 | Discharge: 2020-10-25 | Disposition: A | Discharge status: Home / Self Care | Payer: Medicare HMO | Source: Ambulatory Visit | Attending: Registered Nurse | Admitting: Registered Nurse

## 2020-10-25 DIAGNOSIS — Z1231 Encounter for screening mammogram for malignant neoplasm of breast: Secondary | ICD-10-CM | POA: Diagnosis not present

## 2020-11-05 DIAGNOSIS — Z0000 Encounter for general adult medical examination without abnormal findings: Secondary | ICD-10-CM | POA: Diagnosis not present

## 2020-11-05 DIAGNOSIS — E785 Hyperlipidemia, unspecified: Secondary | ICD-10-CM | POA: Diagnosis not present

## 2020-11-05 DIAGNOSIS — E559 Vitamin D deficiency, unspecified: Secondary | ICD-10-CM | POA: Diagnosis not present

## 2020-11-05 DIAGNOSIS — N390 Urinary tract infection, site not specified: Secondary | ICD-10-CM | POA: Diagnosis not present

## 2020-11-05 DIAGNOSIS — E890 Postprocedural hypothyroidism: Secondary | ICD-10-CM | POA: Diagnosis not present

## 2020-11-12 ENCOUNTER — Other Ambulatory Visit: Payer: Self-pay | Admitting: Registered Nurse

## 2020-11-12 DIAGNOSIS — E7800 Pure hypercholesterolemia, unspecified: Secondary | ICD-10-CM | POA: Diagnosis not present

## 2020-11-12 DIAGNOSIS — F17210 Nicotine dependence, cigarettes, uncomplicated: Secondary | ICD-10-CM | POA: Diagnosis not present

## 2020-11-12 DIAGNOSIS — E890 Postprocedural hypothyroidism: Secondary | ICD-10-CM | POA: Diagnosis not present

## 2020-11-12 DIAGNOSIS — Z0000 Encounter for general adult medical examination without abnormal findings: Secondary | ICD-10-CM | POA: Diagnosis not present

## 2020-11-12 DIAGNOSIS — E559 Vitamin D deficiency, unspecified: Secondary | ICD-10-CM | POA: Diagnosis not present

## 2020-11-12 DIAGNOSIS — Z23 Encounter for immunization: Secondary | ICD-10-CM | POA: Diagnosis not present

## 2020-11-18 ENCOUNTER — Encounter: Payer: Self-pay | Admitting: Internal Medicine

## 2020-11-18 ENCOUNTER — Ambulatory Visit (INDEPENDENT_AMBULATORY_CARE_PROVIDER_SITE_OTHER): Payer: Medicare HMO | Admitting: Internal Medicine

## 2020-11-18 VITALS — BP 148/76 | HR 72 | Ht 64.0 in | Wt 130.0 lb

## 2020-11-18 DIAGNOSIS — K625 Hemorrhage of anus and rectum: Secondary | ICD-10-CM | POA: Diagnosis not present

## 2020-11-18 DIAGNOSIS — R1032 Left lower quadrant pain: Secondary | ICD-10-CM | POA: Diagnosis not present

## 2020-11-18 MED ORDER — PLENVU 140 G PO SOLR: 1.0000 | Freq: Once | ORAL | 0 refills | Status: AC

## 2020-11-18 NOTE — Progress Notes (Signed)
HISTORY OF PRESENT ILLNESS:  Patricia Haney is a 74 y.o. female who was sent today by her primary provider regarding rectal bleeding.  Patient is new to this office.  She tells me that approximately 2 months ago after going out to eat, she returned home and developed fairly severe lower abdominal pain.  This was followed by an episode of moderately severe rectal bleeding.  She had no bleeding since that time.  Her pain resolved.  She did undergo colonoscopy in 2004.  She is also undergone negative Cologuard testing on 2 occasions.  Most recently in 2020.  She does have occasional reflux symptoms but no dysphagia.  She believes that she has hemorrhoids.  Currently without complaints.  REVIEW OF SYSTEMS:  All non-GI ROS negative.  Past Medical History:  Diagnosis Date   High cholesterol    Hypothyroid     History reviewed. No pertinent surgical history.  Social History Patricia Haney  reports that she has been smoking cigarettes. She has been smoking an average of 1 pack per day. She has never used smokeless tobacco. She reports current alcohol use. She reports that she does not use drugs.  family history includes Cancer in her brother.  No Known Allergies     PHYSICAL EXAMINATION: Vital signs: BP (!) 148/76   Pulse 72   Ht 5\' 4"  (1.626 m)   Wt 130 lb (59 kg)   BMI 22.31 kg/m   Constitutional: generally well-appearing, no acute distress Psychiatric: alert and oriented x3, cooperative Eyes: extraocular movements intact, anicteric, conjunctiva pink Mouth: oral pharynx moist, no lesions Neck: supple no lymphadenopathy Cardiovascular: heart regular rate and rhythm, no murmur Lungs: clear to auscultation bilaterally Abdomen: soft, nontender, nondistended, no obvious ascites, no peritoneal signs, normal bowel sounds, no organomegaly Rectal: Deferred until colonoscopy Extremities: no clubbing, cyanosis, or lower extremity edema bilaterally Skin: no lesions on visible  extremities Neuro: No focal deficits.  Cranial nerves intact  ASSESSMENT:  1.  Episode of rectal bleeding associated with abdominal pain.  Resolved   PLAN:  1.  Colonoscopy to further evaluate.The nature of the procedure, as well as the risks, benefits, and alternatives were carefully and thoroughly reviewed with the patient. Ample time for discussion and questions allowed. The patient understood, was satisfied, and agreed to proceed.

## 2020-11-18 NOTE — Patient Instructions (Signed)
If you are age 75 or older, your body mass index should be between 23-30. Your Body mass index is 22.31 kg/m. If this is out of the aforementioned range listed, please consider follow up with your Primary Care Provider.  If you are age 33 or younger, your body mass index should be between 19-25. Your Body mass index is 22.31 kg/m. If this is out of the aformentioned range listed, please consider follow up with your Primary Care Provider.   ________________________________________________________  The [facility] providers would like to encourage you to use [facility] to communicate with providers for non-urgent requests or questions.  Due to long hold times on the telephone, sending your provider a message by [facility] may be a faster and more efficient way to get a response.  Please allow 48 business hours for a response.  Please remember that this is for non-urgent requests.  _______________________________________________________  Patricia Haney have been scheduled for a colonoscopy. Please follow written instructions given to you at your visit today.  Please pick up your prep supplies at the pharmacy within the next 1-3 days. If you use inhalers (even only as needed), please bring them with you on the day of your procedure.

## 2020-12-02 ENCOUNTER — Ambulatory Visit
Admission: RE | Admit: 2020-12-02 | Discharge: 2020-12-02 | Disposition: A | Discharge status: Home / Self Care | Payer: No Typology Code available for payment source | Source: Ambulatory Visit | Attending: Registered Nurse | Admitting: Registered Nurse

## 2020-12-02 DIAGNOSIS — E7800 Pure hypercholesterolemia, unspecified: Secondary | ICD-10-CM

## 2020-12-30 ENCOUNTER — Telehealth: Payer: Self-pay | Admitting: Internal Medicine

## 2020-12-30 NOTE — Telephone Encounter (Signed)
Called and spoke with patient about sending a discount card to her pharmacy. She has advised me that she would be able to pay $60 for her prep. I have confirmed her insurance to send in the right card and her pharmacy to send to correct place.

## 2020-12-30 NOTE — Telephone Encounter (Signed)
Patient called states the Plenvu prep medication is too expensive seeking an alternative. Also requested a call once done so she knows.

## 2021-01-04 NOTE — Telephone Encounter (Signed)
Called Pharmacy they are unsure if the did receive card or not. A new card has been faxed. They have advised that since her insurance is not covering the Plenvu that the card probably won't work. I advised that they needed to run card without insurance.

## 2021-01-04 NOTE — Telephone Encounter (Signed)
Inbound call from patient states that the pharmacy has not received the new prep medication order. Please advise.

## 2021-01-10 ENCOUNTER — Encounter: Payer: Self-pay | Admitting: Internal Medicine

## 2021-01-10 ENCOUNTER — Ambulatory Visit (AMBULATORY_SURGERY_CENTER): Payer: Medicare HMO | Admitting: Internal Medicine

## 2021-01-10 VITALS — BP 144/73 | HR 60 | Temp 97.5°F | Resp 9 | Ht 64.0 in | Wt 130.0 lb

## 2021-01-10 DIAGNOSIS — D128 Benign neoplasm of rectum: Secondary | ICD-10-CM

## 2021-01-10 DIAGNOSIS — K5730 Diverticulosis of large intestine without perforation or abscess without bleeding: Secondary | ICD-10-CM

## 2021-01-10 DIAGNOSIS — R1032 Left lower quadrant pain: Secondary | ICD-10-CM

## 2021-01-10 DIAGNOSIS — K625 Hemorrhage of anus and rectum: Secondary | ICD-10-CM | POA: Diagnosis not present

## 2021-01-10 MED ORDER — SODIUM CHLORIDE 0.9 % IV SOLN: 500.0000 mL | INTRAVENOUS | Status: DC

## 2021-01-10 NOTE — Progress Notes (Signed)
Called to room to assist during endoscopic procedure.  Patient ID and intended procedure confirmed with present staff. Received instructions for my participation in the procedure from the performing physician.  

## 2021-01-10 NOTE — Progress Notes (Signed)
HISTORY OF PRESENT ILLNESS:  I saw this patient in the office November 18, 2020 regarding rectal bleeding.  To this full H&P as outlined below.  I spoke with her short while ago.  No interval changes.  Now for colonoscopy   Patricia Haney is a 75 y.o. female who was sent today by her primary provider regarding rectal bleeding.  Patient is new to this office.  She tells me that approximately 2 months ago after going out to eat, she returned home and developed fairly severe lower abdominal pain.  This was followed by an episode of moderately severe rectal bleeding.  She had no bleeding since that time.  Her pain resolved.  She did undergo colonoscopy in 2004.  She is also undergone negative Cologuard testing on 2 occasions.  Most recently in 2020.  She does have occasional reflux symptoms but no dysphagia.  She believes that she has hemorrhoids.  Currently without complaints.   REVIEW OF SYSTEMS:   All non-GI ROS negative.       Past Medical History:  Diagnosis Date   High cholesterol     Hypothyroid        History reviewed. No pertinent surgical history.   Social History Patricia Haney  reports that she has been smoking cigarettes. She has been smoking an average of 1 pack per day. She has never used smokeless tobacco. She reports current alcohol use. She reports that she does not use drugs.   family history includes Cancer in her brother.   No Known Allergies       PHYSICAL EXAMINATION: Vital signs: BP (!) 148/76    Pulse 72    Ht 5\' 4"  (1.626 m)    Wt 130 lb (59 kg)    BMI 22.31 kg/m   Constitutional: generally well-appearing, no acute distress Psychiatric: alert and oriented x3, cooperative Eyes: extraocular movements intact, anicteric, conjunctiva pink Mouth: oral pharynx moist, no lesions Neck: supple no lymphadenopathy Cardiovascular: heart regular rate and rhythm, no murmur Lungs: clear to auscultation bilaterally Abdomen: soft, nontender, nondistended, no  obvious ascites, no peritoneal signs, normal bowel sounds, no organomegaly Rectal: Deferred until colonoscopy Extremities: no clubbing, cyanosis, or lower extremity edema bilaterally Skin: no lesions on visible extremities Neuro: No focal deficits.  Cranial nerves intact   ASSESSMENT:   1.  Episode of rectal bleeding associated with abdominal pain.  Resolved     PLAN:   1.  Colonoscopy to further evaluate.The nature of the procedure, as well as the risks, benefits, and alternatives were carefully and thoroughly reviewed with the patient. Ample time for discussion and questions allowed. The patient understood, was satisfied, and agreed to proceed.

## 2021-01-10 NOTE — Progress Notes (Signed)
Pt's states no medical or surgical changes since previsit or office visit. 

## 2021-01-10 NOTE — Op Note (Signed)
[facility] Patient Name: Patricia Haney Procedure Date: 01/10/2021 2:12 PM MRN: [id number] Endoscopist: Docia Chuck. Henrene Pastor , MD Age: 75 Referring MD:  Date of Birth: 09/12/1945 Gender: Female Account #: [id number] Procedure:                Colonoscopy with cold snare polypectomy x 1 Indications:              Rectal bleeding Medicines:                Monitored Anesthesia Care Procedure:                Pre-Anesthesia Assessment:                           - Prior to the procedure, a History and Physical                            was performed, and patient medications and                            allergies were reviewed. The patient's tolerance of                            previous anesthesia was also reviewed. The risks                            and benefits of the procedure and the sedation                            options and risks were discussed with the patient.                            All questions were answered, and informed consent                            was obtained. Prior Anticoagulants: The patient has                            taken no previous anticoagulant or antiplatelet                            agents. ASA Grade Assessment: II - A patient with                            mild systemic disease. After reviewing the risks                            and benefits, the patient was deemed in                            satisfactory condition to undergo the procedure.                           After obtaining informed consent, the colonoscope  was passed under direct vision. Throughout the                            procedure, the patient's blood pressure, pulse, and                            oxygen saturations were monitored continuously. The                            Olympus Colonoscope #[id number] was introduced through                            the anus and advanced to the the cecum, identified                            by  appendiceal orifice and ileocecal valve. The                            ileocecal valve, appendiceal orifice, and rectum                            were photographed. The quality of the bowel                            preparation was excellent. The colonoscopy was                            performed without difficulty. The patient tolerated                            the procedure well. The bowel preparation used was                            SUPREP via split dose instruction. Scope In: 2:29:31 PM Scope Out: 2:42:39 PM Scope Withdrawal Time: 0 hours 10 minutes 36 seconds  Total Procedure Duration: 0 hours 13 minutes 8 seconds  Findings:                 A 5 mm polyp was found in the rectum. The polyp was                            removed with a cold snare. Resection and retrieval                            were complete.                           Multiple diverticula were found in the entire colon.                           The exam was otherwise without abnormality on                            direct and retroflexion views. Complications:  No immediate complications. Estimated blood loss:                            None. Estimated Blood Loss:     Estimated blood loss: none. Impression:               - One 5 mm polyp in the rectum, removed with a cold                            snare. Resected and retrieved.                           - Diverticulosis in the entire examined colon.                           - The examination was otherwise normal on direct                            and retroflexion views. Recommendation:           - Repeat colonoscopy is not recommended for                            surveillance.                           - Patient has a contact number available for                            emergencies. The signs and symptoms of potential                            delayed complications were discussed with the                            patient. Return to  normal activities tomorrow.                            Written discharge instructions were provided to the                            patient.                           - Resume previous diet.                           - Continue present medications.                           - Await pathology results. Docia Chuck. Henrene Pastor, MD 01/10/2021 2:48:34 PM This report has been signed electronically.

## 2021-01-10 NOTE — Progress Notes (Signed)
PT taken to PACU. Monitors in place. VSS. Report given to RN. 

## 2021-01-10 NOTE — Patient Instructions (Signed)
Handouts given for polyps and diverticulosis.  YOU HAD AN ENDOSCOPIC PROCEDURE TODAY AT THE [facility]:   Refer to the procedure report that was given to you for any specific questions about what was found during the examination.  If the procedure report does not answer your questions, please call your gastroenterologist to clarify.  If you requested that your care partner not be given the details of your procedure findings, then the procedure report has been included in a sealed envelope for you to review at your convenience later.  YOU SHOULD EXPECT: Some feelings of bloating in the abdomen. Passage of more gas than usual.  Walking can help get rid of the air that was put into your GI tract during the procedure and reduce the bloating. If you had a lower endoscopy (such as a colonoscopy or flexible sigmoidoscopy) you may notice spotting of blood in your stool or on the toilet paper. If you underwent a bowel prep for your procedure, you may not have a normal bowel movement for a few days.  Please Note:  You might notice some irritation and congestion in your nose or some drainage.  This is from the oxygen used during your procedure.  There is no need for concern and it should clear up in a day or so.  SYMPTOMS TO REPORT IMMEDIATELY:  Following lower endoscopy (colonoscopy or flexible sigmoidoscopy):  Excessive amounts of blood in the stool  Significant tenderness or worsening of abdominal pains  Swelling of the abdomen that is new, acute  Fever of 100F or higher  For urgent or emergent issues, a gastroenterologist can be reached at any hour by calling [phone]. Do not use MyChart messaging for urgent concerns.    DIET:  We do recommend a small meal at first, but then you may proceed to your regular diet.  Drink plenty of fluids but you should avoid alcoholic beverages for 24 hours.  ACTIVITY:  You should plan to take it easy for the rest of today and you should NOT DRIVE  or use heavy machinery until tomorrow (because of the sedation medicines used during the test).    FOLLOW UP: Our staff will call the number listed on your records 48-72 hours following your procedure to check on you and address any questions or concerns that you may have regarding the information given to you following your procedure. If we do not reach you, we will leave a message.  We will attempt to reach you two times.  During this call, we will ask if you have developed any symptoms of COVID 19. If you develop any symptoms (ie: fever, flu-like symptoms, shortness of breath, cough etc.) before then, please call [phone].  If you test positive for Covid 19 in the 2 weeks post procedure, please call and report this information to us.    If any biopsies were taken you will be contacted by phone or by letter within the next 1-3 weeks.  Please call us at [phone] if you have not heard about the biopsies in 3 weeks.    SIGNATURES/CONFIDENTIALITY: You and/or your care partner have signed paperwork which will be entered into your electronic medical record.  These signatures attest to the fact that that the information above on your After Visit Summary has been reviewed and is understood.  Full responsibility of the confidentiality of this discharge information lies with you and/or your care-partner.  

## 2021-01-12 ENCOUNTER — Telehealth: Payer: Self-pay | Admitting: *Deleted

## 2021-01-12 NOTE — Telephone Encounter (Signed)
°  Follow up Call-  Call back number 01/10/2021  Post procedure Call Back phone  # [phone]  Permission to leave phone message Yes  Some recent data might be hidden     Patient questions:  Do you have a fever, pain , or abdominal swelling? No. Pain Score  0 *  Have you tolerated food without any problems? Yes.    Have you been able to return to your normal activities? Yes.    Do you have any questions about your discharge instructions: Diet   No. Medications  No. Follow up visit  No.  Do you have questions or concerns about your Care? No.  Actions: * If pain score is 4 or above: No action needed, pain <4.  Have you developed a fever since your procedure? no  2.   Have you had an respiratory symptoms (SOB or cough) since your procedure? no  3.   Have you tested positive for COVID 19 since your procedure no  4.   Have you had any family members/close contacts diagnosed with the COVID 19 since your procedure?  no   If yes to any of these questions please route to Joylene John, RN and Joella Prince, RN

## 2021-01-14 ENCOUNTER — Encounter: Payer: Self-pay | Admitting: Internal Medicine

## 2021-02-16 DIAGNOSIS — N390 Urinary tract infection, site not specified: Secondary | ICD-10-CM | POA: Diagnosis not present

## 2021-02-16 DIAGNOSIS — M2550 Pain in unspecified joint: Secondary | ICD-10-CM | POA: Diagnosis not present

## 2021-02-16 DIAGNOSIS — E785 Hyperlipidemia, unspecified: Secondary | ICD-10-CM | POA: Diagnosis not present

## 2021-04-02 ENCOUNTER — Other Ambulatory Visit: Payer: Self-pay

## 2021-04-02 ENCOUNTER — Encounter (HOSPITAL_BASED_OUTPATIENT_CLINIC_OR_DEPARTMENT_OTHER): Payer: Self-pay | Admitting: Emergency Medicine

## 2021-04-02 ENCOUNTER — Emergency Department (HOSPITAL_BASED_OUTPATIENT_CLINIC_OR_DEPARTMENT_OTHER)
Admission: EM | Admit: 2021-04-02 | Discharge: 2021-04-02 | Disposition: A | Discharge status: Home / Self Care | Payer: Medicare HMO | Attending: Emergency Medicine | Admitting: Emergency Medicine

## 2021-04-02 ENCOUNTER — Emergency Department (HOSPITAL_BASED_OUTPATIENT_CLINIC_OR_DEPARTMENT_OTHER): Payer: Medicare HMO

## 2021-04-02 DIAGNOSIS — K5792 Diverticulitis of intestine, part unspecified, without perforation or abscess without bleeding: Secondary | ICD-10-CM | POA: Diagnosis not present

## 2021-04-02 DIAGNOSIS — R319 Hematuria, unspecified: Secondary | ICD-10-CM | POA: Diagnosis not present

## 2021-04-02 DIAGNOSIS — R109 Unspecified abdominal pain: Secondary | ICD-10-CM | POA: Diagnosis not present

## 2021-04-02 DIAGNOSIS — E039 Hypothyroidism, unspecified: Secondary | ICD-10-CM | POA: Insufficient documentation

## 2021-04-02 LAB — CBC
HCT: 43.4 % (ref 36.0–46.0)
Hemoglobin: 14.3 g/dL (ref 12.0–15.0)
MCH: 31.4 pg (ref 26.0–34.0)
MCHC: 32.9 g/dL (ref 30.0–36.0)
MCV: 95.2 fL (ref 80.0–100.0)
Platelets: 195 10*3/uL (ref 150–400)
RBC: 4.56 MIL/uL (ref 3.87–5.11)
RDW: 14.1 % (ref 11.5–15.5)
WBC: 10 10*3/uL (ref 4.0–10.5)
nRBC: 0 % (ref 0.0–0.2)

## 2021-04-02 LAB — COMPREHENSIVE METABOLIC PANEL
ALT: 16 U/L (ref 0–44)
AST: 20 U/L (ref 15–41)
Albumin: 4.2 g/dL (ref 3.5–5.0)
Alkaline Phosphatase: 74 U/L (ref 38–126)
Anion gap: 9 (ref 5–15)
BUN: 10 mg/dL (ref 8–23)
CO2: 28 mmol/L (ref 22–32)
Calcium: 9.8 mg/dL (ref 8.9–10.3)
Chloride: 101 mmol/L (ref 98–111)
Creatinine, Ser: 0.71 mg/dL (ref 0.44–1.00)
GFR, Estimated: >60 mL/min (ref >60)
Glucose, Bld: 95 mg/dL (ref 70–99)
Potassium: 3.8 mmol/L (ref 3.5–5.1)
Sodium: 138 mmol/L (ref 135–145)
Total Bilirubin: 0.7 mg/dL (ref 0.3–1.2)
Total Protein: 7.6 g/dL (ref 6.5–8.1)

## 2021-04-02 LAB — URINALYSIS, ROUTINE W REFLEX MICROSCOPIC
Bilirubin Urine: NEGATIVE
Glucose, UA: NEGATIVE mg/dL
Ketones, ur: NEGATIVE mg/dL
Leukocytes,Ua: NEGATIVE
Nitrite: NEGATIVE
Protein, ur: NEGATIVE mg/dL
Specific Gravity, Urine: 1.01 (ref 1.005–1.030)
pH: 6 (ref 5.0–8.0)

## 2021-04-02 LAB — LIPASE, BLOOD: Lipase: 22 U/L (ref 11–51)

## 2021-04-02 LAB — URINALYSIS, MICROSCOPIC (REFLEX)

## 2021-04-02 MED ORDER — AMOXICILLIN-POT CLAVULANATE 875-125 MG PO TABS
1.0000 | ORAL_TABLET | Freq: Once | ORAL | Status: AC
  Administered 2021-04-02: 1 via ORAL
  Filled 2021-04-02: qty 1

## 2021-04-02 MED ORDER — AMOXICILLIN-POT CLAVULANATE 875-125 MG PO TABS: 1.0000 | ORAL_TABLET | Freq: Two times a day (BID) | ORAL | 0 refills | Status: DC

## 2021-04-02 MED ORDER — IOHEXOL 300 MG/ML  SOLN
100.0000 mL | Freq: Once | INTRAMUSCULAR | Status: AC | PRN
  Administered 2021-04-02: 100 mL via INTRAVENOUS

## 2021-04-02 NOTE — ED Notes (Signed)
Patient transported to CT 

## 2021-04-02 NOTE — ED Provider Notes (Signed)
[facility] Provider Note   CSN: [id number] Arrival date & time: 04/02/21  1625     History  Chief Complaint  Patient presents with   Abdominal Pain    Patricia Haney is a 76 y.o. female with medical history of hypothyroidism, hypercholesterol.  Patient presents ED for evaluation of abdominal pain.  Patient states that on Thursday she began experiencing diffuse lower abdominal pain that she states comes and goes every 30 minutes.  The patient describes the pain as "sharp, stabbing".  Patient reports that she attempted to alleviate symptoms utilizing Pepto-Bismol which she states mildly relieved her symptoms.  Patient states that she has no history of the same kind of abdominal pain she is experiencing today.  Patient reports recent colonoscopy on 01/10/2021 with Dr. Scarlette Shorts and was noted to have polyp which he removed, also noted to have diverticulitis at this time.  Patient is endorsing abdominal pain.  The patient denies nausea, vomiting, diarrhea, fevers, dysuria, chest pain, shortness of breath, blood in stool, lightheadedness, dizziness, weakness.    Abdominal Pain Associated symptoms: no chest pain, no chills, no diarrhea, no dysuria, no fever, no nausea, no shortness of breath and no vomiting       Home Medications Prior to Admission medications   Medication Sig Start Date End Date Taking? Authorizing Provider  amoxicillin-clavulanate (AUGMENTIN) 875-125 MG tablet Take 1 tablet by mouth 2 (two) times daily. 04/02/21  Yes Azucena Cecil, PA-C  atorvastatin (LIPITOR) 40 MG tablet Take 40 mg by mouth daily.    [provider]  Cholecalciferol (VITAMIN D) 50 MCG (2000 UT) CAPS Take 1 capsule by mouth daily.    [provider]  levothyroxine (SYNTHROID) 50 MCG tablet Take 50 mcg by mouth daily before breakfast.    [provider]      Allergies    Erythromycin and Levothyroxine sodium    Review of Systems    Review of Systems  Constitutional:  Negative for chills and fever.  Respiratory:  Negative for shortness of breath.   Cardiovascular:  Negative for chest pain.  Gastrointestinal:  Positive for abdominal pain. Negative for blood in stool, diarrhea, nausea and vomiting.  Genitourinary:  Negative for dysuria.  Neurological:  Negative for dizziness, weakness and light-headedness.  All other systems reviewed and are negative.  Physical Exam Updated Vital Signs BP (!) 167/88    Pulse 87    Temp 97.7 F (36.5 C)    Resp 18    Ht '5\' 4"'$  (1.626 m)    Wt 59.9 kg    SpO2 100%    BMI 22.66 kg/m  Physical Exam Vitals and nursing note reviewed.  Constitutional:      General: She is not in acute distress.    Appearance: She is well-developed. She is not ill-appearing, toxic-appearing or diaphoretic.  HENT:     Head: Normocephalic and atraumatic.     Mouth/Throat:     Mouth: Mucous membranes are moist.     Pharynx: Oropharynx is clear.  Eyes:     Extraocular Movements: Extraocular movements intact.     Pupils: Pupils are equal, round, and reactive to light.  Cardiovascular:     Rate and Rhythm: Normal rate and regular rhythm.  Pulmonary:     Effort: Pulmonary effort is normal.     Breath sounds: Normal breath sounds. No stridor. No wheezing, rhonchi or rales.  Abdominal:     General: Abdomen is flat. Bowel sounds are normal.  There is no distension. There are no signs of injury.     Palpations: Abdomen is soft. There is no fluid wave, hepatomegaly, splenomegaly or pulsatile mass.     Tenderness: There is abdominal tenderness in the right lower quadrant, suprapubic area and left lower quadrant. There is no right CVA tenderness or left CVA tenderness.  Skin:    General: Skin is warm and dry.     Capillary Refill: Capillary refill takes less than 2 seconds.  Neurological:     Mental Status: She is alert and oriented to person, place, and time.    ED Results / Procedures / Treatments    Labs (all labs ordered are listed, but only abnormal results are displayed) Labs Reviewed  URINALYSIS, ROUTINE W REFLEX MICROSCOPIC - Abnormal; Notable for the following components:      Result Value   Hgb urine dipstick SMALL (*)    All other components within normal limits  URINALYSIS, MICROSCOPIC (REFLEX) - Abnormal; Notable for the following components:   Bacteria, UA RARE (*)    All other components within normal limits  URINE CULTURE  LIPASE, BLOOD  COMPREHENSIVE METABOLIC PANEL  CBC    EKG None  Radiology CT ABDOMEN PELVIS W CONTRAST  Result Date: 04/02/2021 CLINICAL DATA:  Left lower quadrant pain for 3 days. Hematuria. Diverticulosis. EXAM: CT ABDOMEN AND PELVIS WITH CONTRAST TECHNIQUE: Multidetector CT imaging of the abdomen and pelvis was performed using the standard protocol following bolus administration of intravenous contrast. RADIATION DOSE REDUCTION: This exam was performed according to the departmental dose-optimization program which includes automated exposure control, adjustment of the mA and/or kV according to patient size and/or use of iterative reconstruction technique. CONTRAST:  18m OMNIPAQUE IOHEXOL 300 MG/ML  SOLN COMPARISON:  None. FINDINGS: Lower Chest: No acute findings. Hepatobiliary: No hepatic masses identified. Gallbladder is unremarkable. No evidence of biliary ductal dilatation. Pancreas:  No mass or inflammatory changes. Spleen: Within normal limits in size and appearance. Adrenals/Urinary Tract: No masses identified. No evidence of ureteral calculi or hydronephrosis. Stomach/Bowel: Colonic diverticulosis is again seen. Short-segment wall thickening and pericolonic soft tissue stranding is seen involving the proximal sigmoid colon. This is likely due to diverticulitis, although colon carcinoma cannot definitely be excluded. No evidence of perforation, abscess, or bowel obstruction. Vascular/Lymphatic: No pathologically enlarged lymph nodes. No acute  vascular findings. Aortic atherosclerotic calcification noted. Reproductive:  No mass or other significant abnormality. Other:  None. Musculoskeletal:  No suspicious bone lesions identified. IMPRESSION: Probable mild sigmoid diverticulitis, although colon carcinoma cannot definitely be excluded. Recommend continued follow-up by CT in 1-2 months after treatment to confirm resolution. No evidence of perforation, abscess, or other complication. Electronically Signed   By: JMarlaine HindM.D.   On: 04/02/2021 18:13    Procedures Procedures    Medications Ordered in ED Medications  iohexol (OMNIPAQUE) 300 MG/ML solution 100 mL (100 mLs Intravenous Contrast Given 04/02/21 1754)    ED Course/ Medical Decision Making/ A&P                           Medical Decision Making Amount and/or Complexity of Data Reviewed Labs: ordered. Radiology: ordered.  Risk Prescription drug management.   76year old female presents to ED for evaluation of abdominal pain.  Please see HPI for further details. On examination, the patient is afebrile, nontachycardic, not hypoxic.  Patient has clear lung sounds bilaterally.  Patient has abdominal tenderness noted diffusely throughout her lower abdomen.  The  patient denies any dysuria, blood in stool, constipation, diarrhea.  Patient states that she recently was noted to have diverticulitis during her colonoscopy in 2022.  Patient worked up utilizing following labs and imaging studies interpreted by me personally: - Urinalysis shows hemoglobin and urine - Urinalysis microscopic shows rare bacteria - Lipase unremarkable - CBC unremarkable.  There is no elevated white blood cell count. - CMP unremarkable. - CT abdomen pelvis shows signs of diverticulitis.  There is also some concern for colon cancer due to wall thickening.  Patient noted to have polyp removed in 2022 with Dr. Henrene Pastor during colonoscopy.  Patient encouraged and advised to follow back up with Dr. Henrene Pastor for  further evaluation.  At this time, the patient is stable for discharge.  I will discharge the patient on 7 days of Augmentin twice daily.  I have provided the patient with return precautions and she voiced understanding.  I answered all the questions of the patient to her satisfaction.  The patient stable at this time.   Final Clinical Impression(s) / ED Diagnoses Final diagnoses:  Diverticulitis    Rx / DC Orders ED Discharge Orders          Ordered    amoxicillin-clavulanate (AUGMENTIN) 875-125 MG tablet  2 times daily        04/02/21 1856              Azucena Cecil, PA-C 04/02/21 1859    Malvin Johns, MD 04/02/21 2147

## 2021-04-02 NOTE — ED Triage Notes (Signed)
Pt arrives pov, steady gait to triage, with c/o lower abdominal pain x 3 days. Denies n/v/d or constipation, denies dysuria. Denies fever ?

## 2021-04-02 NOTE — ED Notes (Signed)
Pt returned to ED to get first dose of abx due to problems at pharmacy.  ?

## 2021-04-02 NOTE — Discharge Instructions (Addendum)
Return to ED with any new symptoms such as fevers, nausea, vomiting, diarrhea, increased abdominal pain ?Please follow-up with your GI doctor, Dr. Scarlette Shorts, please call and make an appointment for sometime this week.  Please request that Dr. Henrene Pastor evaluate your CT scan of abdomen. ?Please pick up the medication I have sent in for you.  Please begin taking this medication this evening.  You will need to take this medication twice daily for the next 7 days. ?Please utilize soft diet over the next coming days.  Bananas, rice, applesauce, toast, chicken soup are all good options. ?

## 2021-04-04 LAB — URINE CULTURE: Culture: <10000 — AB

## 2021-05-06 DIAGNOSIS — E7800 Pure hypercholesterolemia, unspecified: Secondary | ICD-10-CM | POA: Diagnosis not present

## 2021-05-20 DIAGNOSIS — N3001 Acute cystitis with hematuria: Secondary | ICD-10-CM | POA: Diagnosis not present

## 2021-05-20 DIAGNOSIS — F17210 Nicotine dependence, cigarettes, uncomplicated: Secondary | ICD-10-CM | POA: Diagnosis not present

## 2021-05-20 DIAGNOSIS — E785 Hyperlipidemia, unspecified: Secondary | ICD-10-CM | POA: Diagnosis not present

## 2021-05-20 DIAGNOSIS — E890 Postprocedural hypothyroidism: Secondary | ICD-10-CM | POA: Diagnosis not present

## 2021-05-20 DIAGNOSIS — E559 Vitamin D deficiency, unspecified: Secondary | ICD-10-CM | POA: Diagnosis not present

## 2021-06-08 DIAGNOSIS — H04123 Dry eye syndrome of bilateral lacrimal glands: Secondary | ICD-10-CM | POA: Diagnosis not present

## 2021-06-08 DIAGNOSIS — H40013 Open angle with borderline findings, low risk, bilateral: Secondary | ICD-10-CM | POA: Diagnosis not present

## 2021-06-08 DIAGNOSIS — H02401 Unspecified ptosis of right eyelid: Secondary | ICD-10-CM | POA: Diagnosis not present

## 2021-06-08 DIAGNOSIS — H5211 Myopia, right eye: Secondary | ICD-10-CM | POA: Diagnosis not present

## 2021-06-08 DIAGNOSIS — H52223 Regular astigmatism, bilateral: Secondary | ICD-10-CM | POA: Diagnosis not present

## 2021-06-08 DIAGNOSIS — H5202 Hypermetropia, left eye: Secondary | ICD-10-CM | POA: Diagnosis not present

## 2021-06-08 DIAGNOSIS — H2513 Age-related nuclear cataract, bilateral: Secondary | ICD-10-CM | POA: Diagnosis not present

## 2021-06-30 DIAGNOSIS — R69 Illness, unspecified: Secondary | ICD-10-CM | POA: Diagnosis not present

## 2021-07-06 DIAGNOSIS — D235 Other benign neoplasm of skin of trunk: Secondary | ICD-10-CM | POA: Diagnosis not present

## 2021-07-06 DIAGNOSIS — L821 Other seborrheic keratosis: Secondary | ICD-10-CM | POA: Diagnosis not present

## 2021-07-06 DIAGNOSIS — L918 Other hypertrophic disorders of the skin: Secondary | ICD-10-CM | POA: Diagnosis not present

## 2021-07-06 DIAGNOSIS — L720 Epidermal cyst: Secondary | ICD-10-CM | POA: Diagnosis not present

## 2021-09-23 ENCOUNTER — Other Ambulatory Visit: Payer: Self-pay | Admitting: Registered Nurse

## 2021-09-23 DIAGNOSIS — Z1231 Encounter for screening mammogram for malignant neoplasm of breast: Secondary | ICD-10-CM

## 2021-10-19 ENCOUNTER — Ambulatory Visit
Admission: RE | Admit: 2021-10-19 | Discharge: 2021-10-19 | Disposition: A | Discharge status: Home / Self Care | Payer: Medicare HMO | Source: Ambulatory Visit | Attending: Registered Nurse | Admitting: Registered Nurse

## 2021-10-19 DIAGNOSIS — Z1231 Encounter for screening mammogram for malignant neoplasm of breast: Secondary | ICD-10-CM

## 2021-10-23 HISTORY — PX: CATARACT EXTRACTION: SUR2

## 2021-11-03 ENCOUNTER — Ambulatory Visit
Admission: RE | Admit: 2021-11-03 | Discharge: 2021-11-03 | Disposition: A | Discharge status: Home / Self Care | Payer: Medicare HMO | Source: Ambulatory Visit | Attending: Registered Nurse | Admitting: Registered Nurse

## 2022-01-05 ENCOUNTER — Emergency Department (HOSPITAL_BASED_OUTPATIENT_CLINIC_OR_DEPARTMENT_OTHER)
Admission: EM | Admit: 2022-01-05 | Discharge: 2022-01-05 | Disposition: A | Discharge status: Home / Self Care | Payer: Medicare HMO | Attending: Emergency Medicine | Admitting: Emergency Medicine

## 2022-01-05 ENCOUNTER — Emergency Department (HOSPITAL_COMMUNITY): Payer: Medicare HMO

## 2022-01-05 ENCOUNTER — Other Ambulatory Visit: Payer: Self-pay

## 2022-01-05 ENCOUNTER — Encounter (HOSPITAL_COMMUNITY): Payer: Self-pay

## 2022-01-05 DIAGNOSIS — D72829 Elevated white blood cell count, unspecified: Secondary | ICD-10-CM | POA: Insufficient documentation

## 2022-01-05 DIAGNOSIS — R1032 Left lower quadrant pain: Secondary | ICD-10-CM | POA: Insufficient documentation

## 2022-01-05 DIAGNOSIS — K5792 Diverticulitis of intestine, part unspecified, without perforation or abscess without bleeding: Secondary | ICD-10-CM

## 2022-01-05 LAB — URINALYSIS, ROUTINE W REFLEX MICROSCOPIC
Bilirubin Urine: NEGATIVE
Glucose, UA: NEGATIVE mg/dL
Ketones, ur: NEGATIVE mg/dL
Leukocytes,Ua: NEGATIVE
Nitrite: NEGATIVE
Protein, ur: NEGATIVE mg/dL
Specific Gravity, Urine: 1.01 (ref 1.005–1.030)
pH: 6 (ref 5.0–8.0)

## 2022-01-05 LAB — COMPREHENSIVE METABOLIC PANEL
ALT: 16 U/L (ref 0–44)
AST: 23 U/L (ref 15–41)
Albumin: 3.9 g/dL (ref 3.5–5.0)
Alkaline Phosphatase: 63 U/L (ref 38–126)
Anion gap: 8 (ref 5–15)
BUN: 12 mg/dL (ref 8–23)
CO2: 29 mmol/L (ref 22–32)
Calcium: 9.8 mg/dL (ref 8.9–10.3)
Chloride: 102 mmol/L (ref 98–111)
Creatinine, Ser: 0.71 mg/dL (ref 0.44–1.00)
GFR, Estimated: >60 mL/min (ref >60)
Glucose, Bld: 110 mg/dL — ABNORMAL HIGH (ref 70–99)
Potassium: 3.8 mmol/L (ref 3.5–5.1)
Sodium: 139 mmol/L (ref 135–145)
Total Bilirubin: 0.6 mg/dL (ref 0.3–1.2)
Total Protein: 6.9 g/dL (ref 6.5–8.1)

## 2022-01-05 LAB — URINALYSIS, MICROSCOPIC (REFLEX)

## 2022-01-05 LAB — CBC WITH DIFFERENTIAL/PLATELET
Abs Immature Granulocytes: 0.03 10*3/uL (ref 0.00–0.07)
Basophils Absolute: 0 10*3/uL (ref 0.0–0.1)
Basophils Relative: 0 %
Eosinophils Absolute: 0 10*3/uL (ref 0.0–0.5)
Eosinophils Relative: 0 %
HCT: 40.9 % (ref 36.0–46.0)
Hemoglobin: 13.5 g/dL (ref 12.0–15.0)
Immature Granulocytes: 0 %
Lymphocytes Relative: 11 %
Lymphs Abs: 1.3 10*3/uL (ref 0.7–4.0)
MCH: 31.2 pg (ref 26.0–34.0)
MCHC: 33 g/dL (ref 30.0–36.0)
MCV: 94.5 fL (ref 80.0–100.0)
Monocytes Absolute: 0.7 10*3/uL (ref 0.1–1.0)
Monocytes Relative: 6 %
Neutro Abs: 9.1 10*3/uL — ABNORMAL HIGH (ref 1.7–7.7)
Neutrophils Relative %: 83 %
Platelets: 191 10*3/uL (ref 150–400)
RBC: 4.33 MIL/uL (ref 3.87–5.11)
RDW: 13.6 % (ref 11.5–15.5)
WBC: 11.2 10*3/uL — ABNORMAL HIGH (ref 4.0–10.5)
nRBC: 0 % (ref 0.0–0.2)

## 2022-01-05 LAB — LIPASE, BLOOD: Lipase: 27 U/L (ref 11–51)

## 2022-01-05 MED ORDER — ACETAMINOPHEN 500 MG PO TABS
1000.0000 mg | ORAL_TABLET | Freq: Once | ORAL | Status: AC
  Administered 2022-01-05: 1000 mg via ORAL
  Filled 2022-01-05: qty 2

## 2022-01-05 MED ORDER — AMOXICILLIN-POT CLAVULANATE 875-125 MG PO TABS: 1.0000 | ORAL_TABLET | Freq: Two times a day (BID) | ORAL | 0 refills | Status: DC

## 2022-01-05 MED ORDER — AMOXICILLIN-POT CLAVULANATE 875-125 MG PO TABS
1.0000 | ORAL_TABLET | Freq: Once | ORAL | Status: AC
  Administered 2022-01-05: 1 via ORAL
  Filled 2022-01-05: qty 1

## 2022-01-05 MED ORDER — ACETAMINOPHEN 500 MG PO TABS: 500.0000 mg | ORAL_TABLET | Freq: Four times a day (QID) | ORAL | 0 refills | Status: AC | PRN

## 2022-01-05 MED ORDER — SODIUM CHLORIDE 0.9 % IV BOLUS
1000.0000 mL | Freq: Once | INTRAVENOUS | Status: AC
  Administered 2022-01-05: 1000 mL via INTRAVENOUS

## 2022-01-05 MED ORDER — ONDANSETRON HCL 4 MG/2ML IJ SOLN
4.0000 mg | Freq: Once | INTRAMUSCULAR | Status: AC
  Administered 2022-01-05: 4 mg via INTRAVENOUS
  Filled 2022-01-05: qty 2

## 2022-01-05 MED ORDER — IOHEXOL 300 MG/ML  SOLN
100.0000 mL | Freq: Once | INTRAMUSCULAR | Status: AC | PRN
  Administered 2022-01-05: 100 mL via INTRAVENOUS

## 2022-01-05 NOTE — Discharge Instructions (Addendum)
You have been diagnosed with acute uncomplicated sigmoid diverticulitis.  Please take antibiotic as prescribed for the full duration.  Take Tylenol as needed for aches and pain.  Follow-up with your doctor for further care.  If you develop worsening abdominal pain, persistent nausea and vomiting, or fever do not hesitate to return for further assessment.

## 2022-01-05 NOTE — ED Triage Notes (Signed)
Patient presents to ED via POV from home. Here with left sided abdominal pain that began last night. Endorses nausea, denies vomiting.

## 2022-01-05 NOTE — ED Provider Notes (Signed)
[facility] Provider Note   CSN: [id number] Arrival date & time: 01/05/22  0930     History  Chief Complaint  Patient presents with   Abdominal Pain    Patricia Haney is a 76 y.o. female with Hx of diverticulitis presenting to the ED with chief complaint of left lower quadrant abdominal pain and nausea.  States yesterday she was sitting down when her left lower abdomen became painful.  Was initially intermittent, however has become more constant.  Pain described as sharp and achy without radiation.    Patient states this feels very similar to her previous episodes of diverticulitis.  Denies fevers or chills.  Denies vomiting, diarrhea, or bloody or dark/tarry bowel movements.  Denies urinary symptoms.  Tried Pepto-Bismol and ginger ale without relief.  No Hx of prior abdominal surgeries.  The history is provided by the patient and medical records.  Abdominal Pain     Home Medications Prior to Admission medications   Medication Sig Start Date End Date Taking? Authorizing Provider  amoxicillin-clavulanate (AUGMENTIN) 875-125 MG tablet Take 1 tablet by mouth 2 (two) times daily. 04/02/21   Azucena Cecil, PA-C  atorvastatin (LIPITOR) 40 MG tablet Take 40 mg by mouth daily.    [provider]  Cholecalciferol (VITAMIN D) 50 MCG (2000 UT) CAPS Take 1 capsule by mouth daily.    [provider]  levothyroxine (SYNTHROID) 50 MCG tablet Take 50 mcg by mouth daily before breakfast.    [provider]      Allergies    Erythromycin and Levothyroxine sodium    Review of Systems   Review of Systems  Gastrointestinal:  Positive for abdominal pain.    Physical Exam Updated Vital Signs BP (!) 142/73   Pulse 80   Temp 97.8 F (36.6 C) (Oral)   Resp 17   Ht '5\' 4"'$  (1.626 m)   Wt 56.2 kg   SpO2 100%   BMI 21.28 kg/m  Physical Exam Vitals and nursing note reviewed.  Constitutional:      General: She is not in  acute distress.    Appearance: She is well-developed. She is not ill-appearing, toxic-appearing or diaphoretic.  HENT:     Head: Normocephalic and atraumatic.     Mouth/Throat:     Pharynx: Oropharynx is clear.  Eyes:     General: No scleral icterus.    Conjunctiva/sclera: Conjunctivae normal.  Cardiovascular:     Rate and Rhythm: Normal rate and regular rhythm.     Heart sounds: No murmur heard. Pulmonary:     Effort: Pulmonary effort is normal. No respiratory distress.     Breath sounds: Normal breath sounds. No wheezing.  Chest:     Chest wall: No tenderness.  Abdominal:     General: Abdomen is flat. There is no distension.     Palpations: Abdomen is soft. There is no shifting dullness or mass.     Tenderness: There is generalized abdominal tenderness and tenderness in the left lower quadrant. There is guarding (Mild, involuntary). There is no right CVA tenderness or left CVA tenderness. Negative signs include Murphy's sign and McBurney's sign.  Musculoskeletal:        General: No swelling.     Cervical back: Neck supple.  Skin:    General: Skin is warm and dry.     Capillary Refill: Capillary refill takes less than 2 seconds.     Coloration: Skin is not cyanotic or jaundiced.  Neurological:  Mental Status: She is alert.  Psychiatric:        Mood and Affect: Mood normal.     ED Results / Procedures / Treatments   Labs (all labs ordered are listed, but only abnormal results are displayed) Labs Reviewed  CBC WITH DIFFERENTIAL/PLATELET - Abnormal; Notable for the following components:      Result Value   WBC 11.2 (*)    Neutro Abs 9.1 (*)    All other components within normal limits  COMPREHENSIVE METABOLIC PANEL  LIPASE, BLOOD  URINALYSIS, ROUTINE W REFLEX MICROSCOPIC    EKG None  Radiology No results found.  Procedures Procedures    Medications Ordered in ED Medications  sodium chloride 0.9 % bolus 1,000 mL (1,000 mLs Intravenous New Bag/Given  01/05/22 1038)  ondansetron (ZOFRAN) injection 4 mg (4 mg Intravenous Given 01/05/22 1038)    ED Course/ Medical Decision Making/ A&P                           Medical Decision Making  76 y.o. female presents to the ED for concern of Abdominal Pain     This involves an extensive number of treatment options, and is a complaint that carries with it a high risk of complications and morbidity.  The emergent differential diagnosis prior to evaluation includes, but is not limited to: Diverticulitis, constipation, bowel obstruction, bowel perforation, renal calculi  This is not an exhaustive differential.   Past Medical History / Co-morbidities / Social History: Hx of diverticulitis, hypothyroidism Social Determinants of Health include: Elderly  Additional History:  Obtained by chart review.  Notably ED visit from 04/02/21 for same complaints.  Diagnosed with diverticulitis.  Lab Tests: I ordered, and personally interpreted labs.  The pertinent results include:   WBC 11.2 with leukocytosis.  No significant anemia. CMP, lipase, UA pending  Imaging Studies: Pending  ED Course / Critical Interventions: Pt well-appearing on exam.  Presenting with LLQ and generally diffuse tenderness.  With nausea however no vomiting.  No hematochezia, melena, or urinary symptoms.  Patient is nontoxic, nonseptic appearing, in no apparent distress.  Patient's pain and other symptoms adequately managed in ED.  Fluid bolus, zofran given.  Labs, history and vitals reviewed.  Patient does not meet the SIRS or Sepsis criteria at this time.  Hx of diverticulitis, per pt feels same.   Unfortunately, due to the limitations of this facility without having active CT scanner capabilities, believe patient would best benefit from transfer to ED facility with [facility].  Discussed this with the patient, who is in agreement.    Disposition: ED to ED transfer for CT imaging and completion of workup.  Dr. Melina Copa of [facility] ED plans  to accept this patient.  This chart was dictated using voice recognition software.  Despite best efforts to proofread, errors can occur which can change the documentation meaning.         Final Clinical Impression(s) / ED Diagnoses Final diagnoses:  None    Rx / DC Orders ED Discharge Orders     None         Prince Rome, PA-C 36/14/43 1052    Leanord Asal K, DO 01/05/22 1554

## 2022-01-05 NOTE — ED Provider Notes (Signed)
Patient was transfer from [facility], please see previous providers note for complete H&P.  In short this is a 76 year old female significant history of diverticular disease, presenting with complaint of abdominal pain.  Patient developed left lower quadrant abdominal pain since yesterday with associate nausea and pain felt somewhat similar to prior diverticulitis.  Patient transferred to [facility], ER for CT scan.  On exam patient has tenderness to left lower quadrant without guarding or rebound tenderness.  Patient overall well-appearing, heart and lung sounds normal she is able to move all 4 extremities and mentating appropriately.  -Labs ordered, independently viewed and interpreted by me.  Labs remarkable for WBC 11.2 -The patient was maintained on a cardiac monitor.  I personally viewed and interpreted the cardiac monitored which showed an underlying rhythm of: NSR -Imaging independently viewed and interpreted by me and I agree with radiologist's interpretation.  Result remarkable for abd/pelvis CT showing acute uncomplicated sigmoid diverticulitis -This patient presents to the ED for concern of LLQ pain, this involves an extensive number of treatment options, and is a complaint that carries with it a high risk of complications and morbidity.  The differential diagnosis includes diverticulitis, colitis, appendicitis, pancreatitis, UTI -Co morbidities that complicate the patient evaluation includes hx of diverticular disease -Treatment includes tylenol and augmentin -Reevaluation of the patient after these medicines showed that the patient improved -PCP office notes or outside notes reviewed -Escalation to admission/observation considered: patients feels much better, is comfortable with discharge, and will follow up with PCP -Prescription medication considered, patient comfortable with augmentin and tylenol -Social Determinant of Health considered   BP (!) 143/71   Pulse 61   Temp  98.9 F (37.2 C) (Oral)   Resp 16   Ht '5\' 4"'$  (1.626 m)   Wt 56.2 kg   SpO2 98%   BMI 21.28 kg/m   Results for orders placed or performed during the hospital encounter of 01/05/22  CBC with Differential  Result Value Ref Range   WBC 11.2 (H) 4.0 - 10.5 K/uL   RBC 4.33 3.87 - 5.11 MIL/uL   Hemoglobin 13.5 12.0 - 15.0 g/dL   HCT 40.9 36.0 - 46.0 %   MCV 94.5 80.0 - 100.0 fL   MCH 31.2 26.0 - 34.0 pg   MCHC 33.0 30.0 - 36.0 g/dL   RDW 13.6 11.5 - 15.5 %   Platelets 191 150 - 400 K/uL   nRBC 0.0 0.0 - 0.2 %   Neutrophils Relative % 83 %   Neutro Abs 9.1 (H) 1.7 - 7.7 K/uL   Lymphocytes Relative 11 %   Lymphs Abs 1.3 0.7 - 4.0 K/uL   Monocytes Relative 6 %   Monocytes Absolute 0.7 0.1 - 1.0 K/uL   Eosinophils Relative 0 %   Eosinophils Absolute 0.0 0.0 - 0.5 K/uL   Basophils Relative 0 %   Basophils Absolute 0.0 0.0 - 0.1 K/uL   Immature Granulocytes 0 %   Abs Immature Granulocytes 0.03 0.00 - 0.07 K/uL  Comprehensive metabolic panel  Result Value Ref Range   Sodium 139 135 - 145 mmol/L   Potassium 3.8 3.5 - 5.1 mmol/L   Chloride 102 98 - 111 mmol/L   CO2 29 22 - 32 mmol/L   Glucose, Bld 110 (H) 70 - 99 mg/dL   BUN 12 8 - 23 mg/dL   Creatinine, Ser 0.71 0.44 - 1.00 mg/dL   Calcium 9.8 8.9 - 10.3 mg/dL   Total Protein 6.9 6.5 - 8.1 g/dL  Albumin 3.9 3.5 - 5.0 g/dL   AST 23 15 - 41 U/L   ALT 16 0 - 44 U/L   Alkaline Phosphatase 63 38 - 126 U/L   Total Bilirubin 0.6 0.3 - 1.2 mg/dL   GFR, Estimated >60 >60 mL/min   Anion gap 8 5 - 15  Lipase, blood  Result Value Ref Range   Lipase 27 11 - 51 U/L  Urinalysis, Routine w reflex microscopic  Result Value Ref Range   Color, Urine YELLOW YELLOW   APPearance CLEAR CLEAR   Specific Gravity, Urine 1.010 1.005 - 1.030   pH 6.0 5.0 - 8.0   Glucose, UA NEGATIVE NEGATIVE mg/dL   Hgb urine dipstick TRACE (A) NEGATIVE   Bilirubin Urine NEGATIVE NEGATIVE   Ketones, ur NEGATIVE NEGATIVE mg/dL   Protein, ur NEGATIVE NEGATIVE  mg/dL   Nitrite NEGATIVE NEGATIVE   Leukocytes,Ua NEGATIVE NEGATIVE  Urinalysis, Microscopic (reflex)  Result Value Ref Range   RBC / HPF 0-5 0 - 5 RBC/hpf   WBC, UA 0-5 0 - 5 WBC/hpf   Bacteria, UA RARE (A) NONE SEEN   Squamous Epithelial / LPF 0-5 0 - 5   CT Abdomen Pelvis W Contrast  Result Date: 01/05/2022 CLINICAL DATA:  Left lower quadrant abdominal pain EXAM: CT ABDOMEN AND PELVIS WITH CONTRAST TECHNIQUE: Multidetector CT imaging of the abdomen and pelvis was performed using the standard protocol following bolus administration of intravenous contrast. RADIATION DOSE REDUCTION: This exam was performed according to the departmental dose-optimization program which includes automated exposure control, adjustment of the mA and/or kV according to patient size and/or use of iterative reconstruction technique. CONTRAST:  118m OMNIPAQUE IOHEXOL 300 MG/ML  SOLN COMPARISON:  CT abdomen and pelvis dated 04/02/2021 FINDINGS: Lower chest: No focal consolidation or pulmonary nodule in the lung bases. No pleural effusion or pneumothorax demonstrated. Partially imaged heart size is normal. Hepatobiliary: No focal hepatic lesions. No intra or extrahepatic biliary ductal dilation. Normal gallbladder. Pancreas: No focal lesions or main ductal dilation. Spleen: Normal in size without focal abnormality. Adrenals/Urinary Tract: No adrenal nodules. No suspicious renal mass, calculi or hydronephrosis. No focal bladder wall thickening. Stomach/Bowel: Normal appearance of the stomach. Mural thickening and pericolonic stranding of the sigmoid colon where there are multiple diverticula. Normal appendix. Vascular/Lymphatic: Aortic atherosclerosis. No enlarged abdominal or pelvic lymph nodes. Reproductive: No adnexal masses. Other: No free fluid, fluid collection, or free air. Musculoskeletal: No acute or abnormal lytic or blastic osseous lesions. IMPRESSION: 1. Acute uncomplicated sigmoid diverticulitis. No evidence of  perforation or abscess formation. If there has been no recent endoscopic evaluation, consider colonoscopy once acute inflammation has resolved to exclude underlying mass lesion. 2. Aortic Atherosclerosis (ICD10-I70.0). Electronically Signed   By: LDarrin NipperM.D.   On: 01/05/2022 16:05      TDomenic Moras PA-C 01/05/22 1618    GJeanell Sparrow DO 01/06/22 0005

## 2022-09-05 ENCOUNTER — Ambulatory Visit: Payer: Medicare HMO | Admitting: Cardiovascular Disease

## 2022-10-02 ENCOUNTER — Other Ambulatory Visit: Payer: Self-pay | Admitting: Registered Nurse

## 2022-10-02 DIAGNOSIS — Z1231 Encounter for screening mammogram for malignant neoplasm of breast: Secondary | ICD-10-CM

## 2022-10-24 ENCOUNTER — Encounter: Payer: Self-pay | Admitting: *Deleted

## 2022-10-24 ENCOUNTER — Encounter: Payer: Self-pay | Admitting: Cardiovascular Disease

## 2022-10-24 ENCOUNTER — Ambulatory Visit: Payer: Medicare HMO | Attending: Cardiovascular Disease | Admitting: Cardiovascular Disease

## 2022-10-24 VITALS — BP 150/70 | HR 81 | Ht 64.0 in | Wt 134.2 lb

## 2022-10-24 DIAGNOSIS — R011 Cardiac murmur, unspecified: Secondary | ICD-10-CM | POA: Diagnosis not present

## 2022-10-24 DIAGNOSIS — R002 Palpitations: Secondary | ICD-10-CM

## 2022-10-24 NOTE — Patient Instructions (Signed)
Medication Instructions:  No changes *If you need a refill on your cardiac medications before your next appointment, please call your pharmacy*   Lab Work: None ordered If you have labs (blood work) drawn today and your tests are completely normal, you will receive your results only by: MyChart Message (if you have MyChart) OR A paper copy in the mail If you have any lab test that is abnormal or we need to change your treatment, we will call you to review the results.   Testing/Procedures: Your physician has requested that you have an echocardiogram. Echocardiography is a painless test that uses sound waves to create images of your heart. It provides your doctor with information about the size and shape of your heart and how well your heart's chambers and valves are working. You may receive an ultrasound enhancing agent through an IV if needed to better visualize your heart during the echo.This procedure takes approximately one hour. There are no restrictions for this procedure. This will take place at the [address].     Follow-Up: At [facility], you and your health needs are our priority.  As part of our continuing mission to provide you with exceptional heart care, we have created designated Provider Care Teams.  These Care Teams include your primary Cardiologist (physician) and Advanced Practice Providers (APPs -  Physician Assistants and Nurse Practitioners) who all work together to provide you with the care you need, when you need it.  We recommend signing up for the patient portal called "MyChart".  Sign up information is provided on this After Visit Summary.  MyChart is used to connect with patients for Virtual Visits (Telemedicine).  Patients are able to view lab/test results, encounter notes, upcoming appointments, etc.  Non-urgent messages can be sent to your provider as well.   To learn more about what you can do with MyChart, go to  [email or web address].    Your next appointment:   Follow up based on results

## 2022-10-24 NOTE — Progress Notes (Unsigned)
Cardiology Office Note   Date:  10/26/2022   ID:  Patricia Haney, DOB 1945-08-23, MRN [id number]  PCP:  Patricia Sanger, FNP  Cardiologist:   Patricia Bears, MD   Chief Complaint  Patient presents with   Follow-up    Palpitations no complaints today. Meds reviewed verbally with pt.      History of Present Illness: Patricia Haney is a 77 y.o. female who was referred by Patricia Haney for evaluation of palpitations.  She has no prior cardiac history.  She has known history of hyperlipidemia, tobacco use and hypothyroidism. She reports intermittent palpitations described as skipping especially at night when she is trying to sleep.  This has been more noticeable since her husband died 3 years ago.  She denies chest pain, shortness of breath or syncope.  No prolonged tachycardia.  Her symptoms improved with hydroxyzine.  She smokes half a pack per day. Her sister is under my care for paroxysmal supraventricular tachycardia and nonobstructive peripheral arterial disease. She underwent a 2 weeks ZIO monitor which showed sinus rhythm with short runs of SVT the longest lasted 18 beats.   Past Medical History:  Diagnosis Date   High cholesterol    Hypothyroid     Past Surgical History:  Procedure Laterality Date   CATARACT EXTRACTION Right 10/2021   HEMORRHOID SURGERY       Current Outpatient Medications  Medication Sig Dispense Refill   acetaminophen (TYLENOL) 500 MG tablet Take 1 tablet (500 mg total) by mouth every 6 (six) hours as needed. 30 tablet 0   atorvastatin (LIPITOR) 20 MG tablet Take 20 mg by mouth daily.     Cholecalciferol (VITAMIN D) 50 MCG (2000 UT) CAPS Take 1 capsule by mouth daily.     hydrOXYzine (ATARAX) 25 MG tablet Take 25 mg by mouth as needed.     levothyroxine (SYNTHROID) 75 MCG tablet Take 75 mcg by mouth daily before breakfast.     No current facility-administered medications for this visit.    Allergies:   Erythromycin and  Levothyroxine sodium    Social History:  The patient  reports that she has been smoking cigarettes. She has a 8 pack-year smoking history. She has never used smokeless tobacco. She reports current alcohol use. She reports that she does not use drugs.   Family History:  The patient's family history includes CVA in her father and mother; Cancer in her brother; Heart disease in her sister.    ROS:  Please see the history of present illness.   Otherwise, review of systems are positive for none.   All other systems are reviewed and negative.    PHYSICAL EXAM: VS:  BP (!) 150/70 (BP Location: Right Arm, Patient Position: Sitting, Cuff Size: Normal)   Pulse 81   Ht 5\' 4"  (1.626 m)   Wt 134 lb 4 oz (60.9 kg)   SpO2 98%   BMI 23.04 kg/m  , BMI Body mass index is 23.04 kg/m. GEN: Well nourished, well developed, in no acute distress  HEENT: normal  Neck: no JVD, carotid bruits, or masses Cardiac: RRR; no rubs, or gallops,no edema .  2 out of 6 systolic murmur in the aortic area. Respiratory:  clear to auscultation bilaterally, normal work of breathing GI: soft, nontender, nondistended, + BS MS: no deformity or atrophy  Skin: warm and dry, no rash Neuro:  Strength and sensation are intact Psych: euthymic mood, full affect Distal pulses are normal.  EKG:  EKG  is ordered today. The ekg ordered today demonstrates : Normal sinus rhythm Low voltage QRS Cannot rule out Anterior infarct , age undetermined When compared with ECG of 12-Apr-2019 09:33, Questionable change in QRS axis    Recent Labs: 01/05/2022: ALT 16; BUN 12; Creatinine, Ser 0.71; Hemoglobin 13.5; Platelets 191; Potassium 3.8; Sodium 139    Lipid Panel No results found for: "CHOL", "TRIG", "HDL", "CHOLHDL", "VLDL", "LDLCALC", "LDLDIRECT"    Wt Readings from Last 3 Encounters:  10/24/22 134 lb 4 oz (60.9 kg)  01/05/22 124 lb (56.2 kg)  04/02/21 132 lb (59.9 kg)          10/24/2022    2:04 PM  PAD Screen   Previous PAD dx? No  Previous surgical procedure? No  Pain with walking? No  Feet/toe relief with dangling? No  Painful, non-healing ulcers? No  Extremities discolored? No      ASSESSMENT AND PLAN:  1.  Palpitations: Likely due to premature beats based on symptoms.  However, on ZIO monitor, she was noted to have short runs of SVT the longest lasted 18 beats.  Average heart rate was 68 bpm.  She reports improvement in symptoms overall and thus, we will hold off on adding a medication at this time unless symptoms worsen.  2.  Cardiac murmur: Likely due to calcified aortic valve.  I requested an echocardiogram.  3.  Hyperlipidemia: Currently on atorvastatin.    Disposition:   FU in 3 months.  Signed,  Patricia Bears, MD  10/26/2022 1:46 PM    [facility]

## 2022-10-25 ENCOUNTER — Encounter: Payer: Self-pay | Admitting: Cardiovascular Disease

## 2022-10-25 NOTE — Telephone Encounter (Signed)
This encounter was created in error - please disregard.

## 2022-11-06 ENCOUNTER — Ambulatory Visit
Admission: RE | Admit: 2022-11-06 | Discharge: 2022-11-06 | Disposition: A | Discharge status: Home / Self Care | Payer: Medicare HMO | Source: Ambulatory Visit | Attending: Registered Nurse | Admitting: Registered Nurse

## 2022-11-06 DIAGNOSIS — Z1231 Encounter for screening mammogram for malignant neoplasm of breast: Secondary | ICD-10-CM

## 2022-11-09 ENCOUNTER — Ambulatory Visit (HOSPITAL_COMMUNITY): Payer: Medicare HMO | Attending: Cardiology

## 2022-11-09 DIAGNOSIS — R011 Cardiac murmur, unspecified: Secondary | ICD-10-CM

## 2022-11-09 LAB — ECHOCARDIOGRAM COMPLETE
Area-P 1/2: 2.55 cm2
S' Lateral: 2.9 cm

## 2022-12-13 LAB — LAB REPORT - SCANNED: EGFR: 79

## 2023-01-29 NOTE — Progress Notes (Signed)
 Cardiology Office Note   Date:  01/30/2023   ID:  Patricia Haney, DOB 12-11-1945, MRN [id number]  PCP:  Royden Ronal Czar, FNP  Cardiologist:   Deatrice Cage, MD   No chief complaint on file.     History of Present Illness: Patricia Haney is a 78 y.o. female who is here today for a follow-up visit regarding palpitations.   She has known history of hyperlipidemia, tobacco use and hypothyroidism. She smokes half a pack per day. She was seen recently for intermittent palpitations described as skipping especially at night when she is trying to sleep.  This was more noticeable since her husband died 3 years ago.  No chest pain, shortness of breath or syncope.  Her symptoms improved with hydroxyzine .   Her sister is under my care for paroxysmal supraventricular tachycardia and nonobstructive peripheral arterial disease. She underwent a 2 weeks ZIO monitor which showed sinus rhythm with short runs of SVT the longest lasted 18 beats. She underwent an echocardiogram which showed normal LV systolic function with mildly calcified aortic valve without significant stenosis.   Past Medical History:  Diagnosis Date   High cholesterol    Hypothyroid     Past Surgical History:  Procedure Laterality Date   CATARACT EXTRACTION Right 10/2021   HEMORRHOID SURGERY       Current Outpatient Medications  Medication Sig Dispense Refill   acetaminophen  (TYLENOL ) 500 MG tablet Take 1 tablet (500 mg total) by mouth every 6 (six) hours as needed. 30 tablet 0   atorvastatin (LIPITOR) 20 MG tablet Take 20 mg by mouth daily.     Cholecalciferol (VITAMIN D) 50 MCG (2000 UT) CAPS Take 1 capsule by mouth daily.     hydrOXYzine  (ATARAX ) 25 MG tablet Take 25 mg by mouth as needed.     levothyroxine (SYNTHROID) 75 MCG tablet Take 75 mcg by mouth daily before breakfast.     No current facility-administered medications for this visit.    Allergies:   Erythromycin and Levothyroxine sodium     Social History:  The patient  reports that she has been smoking cigarettes. She has a 8 pack-year smoking history. She has never used smokeless tobacco. She reports current alcohol use. She reports that she does not use drugs.   Family History:  The patient's family history includes CVA in her father and mother; Cancer in her brother; Heart disease in her sister.    ROS:  Please see the history of present illness.   Otherwise, review of systems are positive for none.   All other systems are reviewed and negative.    PHYSICAL EXAM: VS:  BP 138/80 (BP Location: Left Arm, Patient Position: Sitting, Cuff Size: Normal)   Pulse 79   Ht 5' 4 (1.626 m)   Wt 133 lb 10.6 oz (60.6 kg)   SpO2 99%   BMI 22.94 kg/m  , BMI Body mass index is 22.94 kg/m. GEN: Well nourished, well developed, in no acute distress  HEENT: normal  Neck: no JVD, carotid bruits, or masses Cardiac: RRR; no rubs, or gallops,no edema .  1/ 6 systolic murmur in the aortic area. Respiratory:  clear to auscultation bilaterally, normal work of breathing GI: soft, nontender, nondistended, + BS MS: no deformity or atrophy  Skin: warm and dry, no rash Neuro:  Strength and sensation are intact Psych: euthymic mood, full affect Distal pulses are normal.  EKG:  EKG is ordered today. The ekg ordered today demonstrates : Normal  sinus rhythm Low voltage QRS Cannot rule out Anterior infarct , age undetermined When compared with ECG of 12-Apr-2019 09:33, Questionable change in QRS axis    Recent Labs: No results found for requested labs within last 365 days.    Lipid Panel No results found for: CHOL, TRIG, HDL, CHOLHDL, VLDL, LDLCALC, LDLDIRECT    Wt Readings from Last 3 Encounters:  01/30/23 133 lb 10.6 oz (60.6 kg)  10/24/22 134 lb 4 oz (60.9 kg)  01/05/22 124 lb (56.2 kg)          10/24/2022    2:04 PM  PAD Screen  Previous PAD dx? No  Previous surgical procedure? No  Pain with walking? No   Feet/toe relief with dangling? No  Painful, non-healing ulcers? No  Extremities discolored? No      ASSESSMENT AND PLAN:  1.  Palpitations: Likely due to premature beats based on symptoms.  However, on ZIO monitor, she was noted to have short runs of SVT the longest lasted 18 beats.  Average heart rate was 68 bpm.  She reports improvement in symptoms overall and thus, we will hold off on adding a medication at this time unless symptoms worsen.  2.  Cardiac murmur: This is due to aortic sclerosis.  There was no significant stenosis on echocardiogram in October.  Her ejection fraction was normal.  3.  Hyperlipidemia: Currently on atorvastatin.    Disposition:   FU in 12 months.  Signed,  Deatrice Cage, MD  01/30/2023 11:51 AM    [facility]

## 2023-01-30 ENCOUNTER — Encounter: Payer: Self-pay | Admitting: Cardiovascular Disease

## 2023-01-30 ENCOUNTER — Ambulatory Visit: Payer: Medicare HMO | Attending: Cardiovascular Disease | Admitting: Cardiovascular Disease

## 2023-01-30 VITALS — BP 138/80 | HR 79 | Ht 64.0 in | Wt 133.7 lb

## 2023-01-30 DIAGNOSIS — E785 Hyperlipidemia, unspecified: Secondary | ICD-10-CM | POA: Diagnosis not present

## 2023-01-30 DIAGNOSIS — R011 Cardiac murmur, unspecified: Secondary | ICD-10-CM | POA: Diagnosis not present

## 2023-01-30 DIAGNOSIS — R002 Palpitations: Secondary | ICD-10-CM | POA: Diagnosis not present

## 2023-01-30 NOTE — Patient Instructions (Signed)
 Medication Instructions:  No changes *If you need a refill on your cardiac medications before your next appointment, please call your pharmacy*   Lab Work: None ordered If you have labs (blood work) drawn today and your tests are completely normal, you will receive your results only by: MyChart Message (if you have MyChart) OR A paper copy in the mail If you have any lab test that is abnormal or we need to change your treatment, we will call you to review the results.   Testing/Procedures: None ordered   Follow-Up: At [facility], you and your health needs are our priority.  As part of our continuing mission to provide you with exceptional heart care, we have created designated Provider Care Teams.  These Care Teams include your primary Cardiologist (physician) and Advanced Practice Providers (APPs -  Physician Assistants and Nurse Practitioners) who all work together to provide you with the care you need, when you need it.  We recommend signing up for the patient portal called "MyChart".  Sign up information is provided on this After Visit Summary.  MyChart is used to connect with patients for Virtual Visits (Telemedicine).  Patients are able to view lab/test results, encounter notes, upcoming appointments, etc.  Non-urgent messages can be sent to your provider as well.   To learn more about what you can do with MyChart, go to [email or web address].    Your next appointment:   12 month(s)  Provider:   Lorine Bears, MD

## 2023-03-12 ENCOUNTER — Other Ambulatory Visit: Payer: Self-pay

## 2023-03-12 ENCOUNTER — Encounter (HOSPITAL_BASED_OUTPATIENT_CLINIC_OR_DEPARTMENT_OTHER): Payer: Self-pay | Admitting: Emergency Medicine

## 2023-03-12 ENCOUNTER — Emergency Department (HOSPITAL_BASED_OUTPATIENT_CLINIC_OR_DEPARTMENT_OTHER)
Admission: EM | Admit: 2023-03-12 | Discharge: 2023-03-12 | Disposition: A | Discharge status: Home / Self Care | Payer: Medicare HMO | Attending: Emergency Medicine | Admitting: Emergency Medicine

## 2023-03-12 DIAGNOSIS — L299 Pruritus, unspecified: Secondary | ICD-10-CM | POA: Diagnosis present

## 2023-03-12 DIAGNOSIS — K5732 Diverticulitis of large intestine without perforation or abscess without bleeding: Secondary | ICD-10-CM | POA: Diagnosis not present

## 2023-03-12 DIAGNOSIS — T7840XA Allergy, unspecified, initial encounter: Secondary | ICD-10-CM | POA: Insufficient documentation

## 2023-03-12 LAB — URINALYSIS, MICROSCOPIC (REFLEX)

## 2023-03-12 LAB — COMPREHENSIVE METABOLIC PANEL
ALT: 20 U/L (ref 0–44)
AST: 25 U/L (ref 15–41)
Albumin: 4.1 g/dL (ref 3.5–5.0)
Alkaline Phosphatase: 72 U/L (ref 38–126)
Anion gap: 9 (ref 5–15)
BUN: 16 mg/dL (ref 8–23)
CO2: 28 mmol/L (ref 22–32)
Calcium: 9.4 mg/dL (ref 8.9–10.3)
Chloride: 102 mmol/L (ref 98–111)
Creatinine, Ser: 0.67 mg/dL (ref 0.44–1.00)
GFR, Estimated: >60 mL/min (ref >60)
Glucose, Bld: 92 mg/dL (ref 70–99)
Potassium: 3.9 mmol/L (ref 3.5–5.1)
Sodium: 139 mmol/L (ref 135–145)
Total Bilirubin: 0.7 mg/dL (ref 0.0–1.2)
Total Protein: 7.3 g/dL (ref 6.5–8.1)

## 2023-03-12 LAB — CBC
HCT: 42.6 % (ref 36.0–46.0)
Hemoglobin: 13.9 g/dL (ref 12.0–15.0)
MCH: 30.9 pg (ref 26.0–34.0)
MCHC: 32.6 g/dL (ref 30.0–36.0)
MCV: 94.7 fL (ref 80.0–100.0)
Platelets: 224 10*3/uL (ref 150–400)
RBC: 4.5 MIL/uL (ref 3.87–5.11)
RDW: 13.9 % (ref 11.5–15.5)
WBC: 8 10*3/uL (ref 4.0–10.5)
nRBC: 0 % (ref 0.0–0.2)

## 2023-03-12 LAB — URINALYSIS, ROUTINE W REFLEX MICROSCOPIC
Bilirubin Urine: NEGATIVE
Glucose, UA: NEGATIVE mg/dL
Ketones, ur: NEGATIVE mg/dL
Leukocytes,Ua: NEGATIVE
Nitrite: NEGATIVE
Protein, ur: NEGATIVE mg/dL
Specific Gravity, Urine: <=1.005 (ref 1.005–1.030)
pH: 5.5 (ref 5.0–8.0)

## 2023-03-12 MED ORDER — METRONIDAZOLE 500 MG PO TABS: 500.0000 mg | ORAL_TABLET | Freq: Two times a day (BID) | ORAL | 0 refills | Status: DC

## 2023-03-12 MED ORDER — PREDNISONE 50 MG PO TABS: 50.0000 mg | ORAL_TABLET | Freq: Every day | ORAL | 0 refills | Status: DC

## 2023-03-12 MED ORDER — METRONIDAZOLE 500 MG PO TABS
500.0000 mg | ORAL_TABLET | Freq: Once | ORAL | Status: AC
  Administered 2023-03-12: 500 mg via ORAL
  Filled 2023-03-12: qty 1

## 2023-03-12 MED ORDER — CIPROFLOXACIN HCL 500 MG PO TABS: 500.0000 mg | ORAL_TABLET | Freq: Two times a day (BID) | ORAL | 0 refills | Status: DC

## 2023-03-12 MED ORDER — HYDROXYZINE HCL 25 MG PO TABS: 25.0000 mg | ORAL_TABLET | Freq: Four times a day (QID) | ORAL | 0 refills | Status: AC

## 2023-03-12 MED ORDER — PREDNISONE 50 MG PO TABS
50.0000 mg | ORAL_TABLET | Freq: Once | ORAL | Status: AC
  Administered 2023-03-12: 50 mg via ORAL
  Filled 2023-03-12: qty 1

## 2023-03-12 MED ORDER — CIPROFLOXACIN HCL 500 MG PO TABS
500.0000 mg | ORAL_TABLET | Freq: Once | ORAL | Status: AC
  Administered 2023-03-12: 500 mg via ORAL
  Filled 2023-03-12: qty 1

## 2023-03-12 NOTE — Discharge Instructions (Addendum)
 Take the medication as prescribed. Follow up with your doctor this week for recheck.   Return to the ED with any new or worsening symptoms at any time.   Medications recommended: Atarax every 6 hours as needed for itching, allergic reaction Prednisone once daily for an addition 3 days (first dose given in ED) Cipro and Flagyl twice daily for 7 days to treat presumed diverticulitis

## 2023-03-12 NOTE — ED Provider Notes (Signed)
 [place] [facility] Provider Note   CSN: [id number] Arrival date & time: 03/12/23  1054     History  Chief Complaint  Patient presents with   Pruritis    Patricia Haney is a 78 y.o. female.  Patient to ED for evaluation and treatment of generalized itching that started several days after starting Augmentin. No SOB, nausea or vomiting, lip/tongue swelling or difficulty swallowing. She has Pepcid, Benadryl and Zyrtec at home but this is not helping. She was started on Augmentin to treat "either a UTI or diverticulitis". She continues to have discomfort in the lower abdomen and reports BRB after a bowel movement earlier today. Subsequent bowel movement today without bleeding. No fever, vomiting. No urinary symptoms.   The history is provided by the patient. No language interpreter was used.       Home Medications Prior to Admission medications   Medication Sig Start Date End Date Taking? Authorizing Provider  ciprofloxacin (CIPRO) 500 MG tablet Take 1 tablet (500 mg total) by mouth every 12 (twelve) hours. 03/12/23  Yes Elpidio Anis, PA-C  hydrOXYzine (ATARAX) 25 MG tablet Take 1 tablet (25 mg total) by mouth every 6 (six) hours. 03/12/23  Yes Melba Araki, Melvenia Beam, PA-C  metroNIDAZOLE (FLAGYL) 500 MG tablet Take 1 tablet (500 mg total) by mouth 2 (two) times daily. 03/12/23  Yes Naitik Hermann, Melvenia Beam, PA-C  predniSONE (DELTASONE) 50 MG tablet Take 1 tablet (50 mg total) by mouth daily. 03/12/23  Yes Elpidio Anis, PA-C  acetaminophen (TYLENOL) 500 MG tablet Take 1 tablet (500 mg total) by mouth every 6 (six) hours as needed. 01/05/22   Fayrene Helper, PA-C  atorvastatin (LIPITOR) 20 MG tablet Take 20 mg by mouth daily.    [provider]  Cholecalciferol (VITAMIN D) 50 MCG (2000 UT) CAPS Take 1 capsule by mouth daily.    [provider]  levothyroxine (SYNTHROID) 75 MCG tablet Take 75 mcg by mouth daily before breakfast.    [provider]      Allergies    Erythromycin and Levothyroxine sodium    Review of Systems   Review of Systems  Physical Exam Updated Vital Signs BP (!) 173/82   Pulse 79   Temp 97.6 F (36.4 C) (Oral)   Resp 17   Wt 60.8 kg   SpO2 99%   BMI 23.00 kg/m  Physical Exam Vitals and nursing note reviewed.  Constitutional:      General: She is not in acute distress.    Appearance: Normal appearance. She is well-developed. She is not ill-appearing.  HENT:     Head: Normocephalic.     Comments: No facial swelling.  Cardiovascular:     Rate and Rhythm: Normal rate and regular rhythm.     Heart sounds: No murmur heard. Pulmonary:     Effort: Pulmonary effort is normal.     Breath sounds: Normal breath sounds. No wheezing, rhonchi or rales.  Abdominal:     Palpations: Abdomen is soft.     Tenderness: There is no abdominal tenderness. There is no guarding or rebound.  Musculoskeletal:        General: Normal range of motion.     Cervical back: Normal range of motion and neck supple.  Skin:    General: Skin is warm and dry.     Findings: No rash.  Neurological:     General: No focal deficit present.     Mental Status: She is alert and oriented  to person, place, and time.     ED Results / Procedures / Treatments   Labs (all labs ordered are listed, but only abnormal results are displayed) Labs Reviewed  URINALYSIS, ROUTINE W REFLEX MICROSCOPIC - Abnormal; Notable for the following components:      Result Value   Hgb urine dipstick SMALL (*)    All other components within normal limits  URINALYSIS, MICROSCOPIC (REFLEX) - Abnormal; Notable for the following components:   Bacteria, UA RARE (*)    All other components within normal limits  COMPREHENSIVE METABOLIC PANEL  CBC    EKG None  Radiology No results found.  Procedures Procedures    Medications Ordered in ED Medications  predniSONE (DELTASONE) tablet 50 mg (50 mg Oral Given 03/12/23 1631)   ciprofloxacin (CIPRO) tablet 500 mg (500 mg Oral Given 03/12/23 1631)  metroNIDAZOLE (FLAGYL) tablet 500 mg (500 mg Oral Given 03/12/23 1631)    ED Course/ Medical Decision Making/ A&P Clinical Course as of 03/12/23 1636  Mon Mar 12, 2023  1620 Patient with generalized itching after being started on Augment one week ago. Stopped taking the Augmentin 3 days ago but reports itching continues. No other ss/sx of allergic reaction. Was being treated for diverticulitis vs UTI. Reports isolated episode of rectal bleeding today, normal color stool, with no bleeding after a subsequent BM.  [SU]  1629 Discussed with Dr. Deretha Emory. She will list Augmentin as an allergy from now on. Will do short course steroids, Atarax, Pepcid. Start Cipro/Flagyl for mild diverticular infection (suspected). Close PCP follow up this week.  [SU]    Clinical Course User Index [SU] Elpidio Anis, PA-C                                 Medical Decision Making Amount and/or Complexity of Data Reviewed Labs: ordered.           Final Clinical Impression(s) / ED Diagnoses Final diagnoses:  Allergic reaction, initial encounter  Diverticulitis of rectosigmoid    Rx / DC Orders ED Discharge Orders          Ordered    ciprofloxacin (CIPRO) 500 MG tablet  Every 12 hours        03/12/23 1634    metroNIDAZOLE (FLAGYL) 500 MG tablet  2 times daily        03/12/23 1634    predniSONE (DELTASONE) 50 MG tablet  Daily        03/12/23 1634    hydrOXYzine (ATARAX) 25 MG tablet  Every 6 hours        03/12/23 1634              Elpidio Anis, PA-C 03/12/23 1637    Vanetta Mulders, MD 03/14/23 1106

## 2023-03-12 NOTE — ED Triage Notes (Signed)
 Reports was prescribed amoxicillin 1 week ago , started having itching with no hives , stooped taking as instructed 3 days ago yet still itches , and no hives .   Adds had bright red blood after passing BM today .  Denies abd pain .

## 2023-06-12 LAB — LAB REPORT - SCANNED
EGFR (African American): 85
TSH: 1.47 (ref 0.41–5.90)

## 2023-08-15 ENCOUNTER — Ambulatory Visit
Admission: EM | Admit: 2023-08-15 | Discharge: 2023-08-15 | Disposition: A | Discharge status: Home / Self Care | Attending: Physician Assistant | Admitting: Physician Assistant

## 2023-08-15 ENCOUNTER — Encounter: Payer: Self-pay | Admitting: *Deleted

## 2023-08-15 ENCOUNTER — Other Ambulatory Visit: Payer: Self-pay

## 2023-08-15 DIAGNOSIS — S91311A Laceration without foreign body, right foot, initial encounter: Secondary | ICD-10-CM

## 2023-08-15 HISTORY — DX: Diverticulitis of intestine, part unspecified, without perforation or abscess without bleeding: K57.92

## 2023-08-15 MED ORDER — DOXYCYCLINE HYCLATE 100 MG PO CAPS: 100.0000 mg | ORAL_CAPSULE | Freq: Two times a day (BID) | ORAL | 0 refills | Status: AC

## 2023-08-15 NOTE — ED Triage Notes (Signed)
 Pt gouged to back of her right heel on to corner of her front door 2 days ago. She has been cleaning it with soap and alcohol. She wants to have it checked for infection. Last tetanus was 2018

## 2023-08-15 NOTE — ED Provider Notes (Addendum)
 [facility]    CSN: [id number] Arrival date & time: 08/15/23  1100      History   Chief Complaint Chief Complaint  Patient presents with   Wound Check    HPI Patricia Haney is a 78 y.o. female.   Patient here today for evaluation of laceration to the back of her right heel that occurred 2 days ago.  She reports that a metal door caught the back of her foot and created a laceration.  She has been cleaning with soap and alcohol which was helpful.  She is concerned it might be infected as she saw some purulent appearing drainage earlier.  Her last tetanus vaccination was in 2018.  She denies any fever or other symptoms.  The history is provided by the patient.  Wound Check Pertinent negatives include no abdominal pain and no shortness of breath.    Past Medical History:  Diagnosis Date   Diverticulitis    High cholesterol    Hypothyroid     There are no active problems to display for this patient.   Past Surgical History:  Procedure Laterality Date   CATARACT EXTRACTION Right 10/2021   HEMORRHOID SURGERY      OB History   No obstetric history on file.      Home Medications    Prior to Admission medications   Medication Sig Start Date End Date Taking? Authorizing Provider  atorvastatin (LIPITOR) 20 MG tablet Take 20 mg by mouth daily.   Yes [provider]  Cholecalciferol (VITAMIN D) 50 MCG (2000 UT) CAPS Take 1 capsule by mouth daily.   Yes [provider]  doxycycline  (VIBRAMYCIN ) 100 MG capsule Take 1 capsule (100 mg total) by mouth 2 (two) times daily for 7 days. 08/15/23 08/22/23 Yes Billy Asberry FALCON, PA-C  hydrOXYzine  (ATARAX ) 25 MG tablet Take 1 tablet (25 mg total) by mouth every 6 (six) hours. 03/12/23  Yes Odell Balls, PA-C  levothyroxine (SYNTHROID) 75 MCG tablet Take 75 mcg by mouth daily before breakfast.   Yes [provider]  acetaminophen  (TYLENOL ) 500 MG tablet Take 1 tablet (500 mg total) by mouth  every 6 (six) hours as needed. 01/05/22   Nivia Colon, PA-C  predniSONE  (DELTASONE ) 50 MG tablet Take 1 tablet (50 mg total) by mouth daily. Patient not taking: Reported on 08/15/2023 03/12/23   Odell Balls, PA-C    Family History Family History  Problem Relation Age of Onset   CVA Mother    CVA Father    Heart disease Sister    Cancer Brother     Social History Social History   Tobacco Use   Smoking status: Every Day    Current packs/day: 0.50    Average packs/day: 0.5 packs/day for 16.0 years (8.0 ttl pk-yrs)    Types: Cigarettes   Smokeless tobacco: Never  Vaping Use   Vaping status: Never Used  Substance Use Topics   Alcohol use: Yes    Comment: occ   Drug use: Never     Allergies   Erythromycin, Levothyroxine sodium, and Amoxicillin -pot clavulanate   Review of Systems Review of Systems  Constitutional:  Negative for chills and fever.  Eyes:  Negative for discharge and redness.  Respiratory:  Negative for shortness of breath.   Gastrointestinal:  Negative for abdominal pain, nausea and vomiting.  Skin:  Positive for color change and wound.     Physical Exam Triage Vital Signs ED Triage Vitals  Encounter Vitals Group  BP 08/15/23 1115 (!) 174/83     Girls Systolic BP Percentile --      Girls Diastolic BP Percentile --      Boys Systolic BP Percentile --      Boys Diastolic BP Percentile --      Pulse Rate 08/15/23 1115 68     Resp 08/15/23 1115 16     Temp 08/15/23 1115 97.7 F (36.5 C)     Temp Source 08/15/23 1115 Oral     SpO2 08/15/23 1115 98 %     Weight --      Height --      Head Circumference --      Peak Flow --      Pain Score 08/15/23 1112 2     Pain Loc --      Pain Education --      Exclude from Growth Chart --    No data found.  Updated Vital Signs BP (!) 174/83 (BP Location: Left Arm)   Pulse 68   Temp 97.7 F (36.5 C) (Oral)   Resp 16   SpO2 98%   Visual Acuity Right Eye Distance:   Left Eye Distance:    Bilateral Distance:    Right Eye Near:   Left Eye Near:    Bilateral Near:     Physical Exam Vitals and nursing note reviewed.  Constitutional:      General: She is not in acute distress.    Appearance: Normal appearance. She is not ill-appearing.  HENT:     Head: Normocephalic and atraumatic.  Eyes:     Conjunctiva/sclera: Conjunctivae normal.  Cardiovascular:     Rate and Rhythm: Normal rate.  Pulmonary:     Effort: Pulmonary effort is normal. No respiratory distress.  Skin:    Comments: See photo  Neurological:     Mental Status: She is alert.  Psychiatric:        Mood and Affect: Mood normal.        Behavior: Behavior normal.        Thought Content: Thought content normal.      UC Treatments / Results  Labs (all labs ordered are listed, but only abnormal results are displayed) Labs Reviewed - No data to display  EKG   Radiology No results found.  Procedures Procedures (including critical care time)  Medications Ordered in UC Medications - No data to display  Initial Impression / Assessment and Plan / UC Course  I have reviewed the triage vital signs and the nursing notes.  Pertinent labs & imaging results that were available during my care of the patient were reviewed by me and considered in my medical decision making (see chart for details).     Wound with mild surrounding erythema.  Discussed that this could be part of routine healing however given mechanism of injury will order antibiotics to cover possible infection and advise she keep area clean and follow-up with any further concerns.  Tdap offered but declined by patient.    Final Clinical Impressions(s) / UC Diagnoses   Final diagnoses:  Laceration of right foot, initial encounter   Discharge Instructions   None    ED Prescriptions     Medication Sig Dispense Auth. Provider   doxycycline  (VIBRAMYCIN ) 100 MG capsule Take 1 capsule (100 mg total) by mouth 2 (two) times daily for 7  days. 14 capsule Billy Asberry FALCON, PA-C      PDMP not reviewed this encounter.   Billy Asberry  F, PA-C 08/15/23 1242    Billy Asberry FALCON, PA-C 08/15/23 1242

## 2023-08-25 ENCOUNTER — Encounter: Payer: Self-pay | Admitting: Emergency Medicine

## 2023-08-25 ENCOUNTER — Ambulatory Visit
Admission: EM | Admit: 2023-08-25 | Discharge: 2023-08-25 | Disposition: A | Discharge status: Home / Self Care | Attending: Internal Medicine | Admitting: Internal Medicine

## 2023-08-25 DIAGNOSIS — S91311D Laceration without foreign body, right foot, subsequent encounter: Secondary | ICD-10-CM | POA: Diagnosis not present

## 2023-08-25 DIAGNOSIS — Z5189 Encounter for other specified aftercare: Secondary | ICD-10-CM

## 2023-08-25 MED ORDER — MUPIROCIN 2 % EX OINT: 1.0000 | TOPICAL_OINTMENT | Freq: Two times a day (BID) | CUTANEOUS | 1 refills | Status: DC

## 2023-08-25 NOTE — ED Triage Notes (Signed)
 Pt st's she just wants to make sure the wound on her right heel isn't getting infected  Pt st's she has 3 antibiotic pills left to take

## 2023-08-25 NOTE — Discharge Instructions (Addendum)
 Right heel wound is healing appropriately.  Redness is secondary to normal healing and some inflammation.  Recommend finishing antibiotics.  Will prescribe mupirocin  antibiotic ointment to apply 2-3 times daily until the area is healed.  Do not apply alcohol or peroxide to the wound.  Continue to wash the wound with soap and water twice daily.  Follow-up with urgent care as needed.

## 2023-08-25 NOTE — ED Provider Notes (Signed)
 [facility]    CSN: [id number] Arrival date & time: 08/25/23  0813      History   Chief Complaint Chief Complaint  Patient presents with   Wound Check    HPI Patricia Haney is a 78 y.o. female.   78 year old female who presents urgent care needing wound check on her right heel.  She was seen on July 23 secondary to a wound on the right heel.  The area was cleansed and the patient was started on doxycycline .  She is almost through with her antibiotics and wanted to have the area rechecked as there was still some redness present.  She has actually been cleaning the wound with soap and water and alcohol.  She denies any fevers or chills.  She is not having any severe pain but the area is still very sore.   Wound Check Pertinent negatives include no chest pain, no abdominal pain and no shortness of breath.    Past Medical History:  Diagnosis Date   Diverticulitis    High cholesterol    Hypothyroid     There are no active problems to display for this patient.   Past Surgical History:  Procedure Laterality Date   CATARACT EXTRACTION Right 10/2021   HEMORRHOID SURGERY      OB History   No obstetric history on file.      Home Medications    Prior to Admission medications   Medication Sig Start Date End Date Taking? Authorizing Provider  mupirocin  ointment (BACTROBAN ) 2 % Apply 1 Application topically 2 (two) times daily. 08/25/23  Yes Shatoria Stooksbury A, PA-C  acetaminophen  (TYLENOL ) 500 MG tablet Take 1 tablet (500 mg total) by mouth every 6 (six) hours as needed. 01/05/22   Nivia Colon, PA-C  atorvastatin (LIPITOR) 20 MG tablet Take 20 mg by mouth daily.    [provider]  Cholecalciferol (VITAMIN D) 50 MCG (2000 UT) CAPS Take 1 capsule by mouth daily.    [provider]  hydrOXYzine  (ATARAX ) 25 MG tablet Take 1 tablet (25 mg total) by mouth every 6 (six) hours. 03/12/23   Odell Balls, PA-C  levothyroxine (SYNTHROID) 75 MCG  tablet Take 75 mcg by mouth daily before breakfast.    [provider]  predniSONE  (DELTASONE ) 50 MG tablet Take 1 tablet (50 mg total) by mouth daily. Patient not taking: Reported on 08/15/2023 03/12/23   Odell Balls, PA-C    Family History Family History  Problem Relation Age of Onset   CVA Mother    CVA Father    Heart disease Sister    Cancer Brother     Social History Social History   Tobacco Use   Smoking status: Every Day    Current packs/day: 0.50    Average packs/day: 0.5 packs/day for 16.0 years (8.0 ttl pk-yrs)    Types: Cigarettes   Smokeless tobacco: Never  Vaping Use   Vaping status: Never Used  Substance Use Topics   Alcohol use: Yes    Comment: occ   Drug use: Never     Allergies   Erythromycin, Levothyroxine sodium, and Amoxicillin -pot clavulanate   Review of Systems Review of Systems  Constitutional:  Negative for chills and fever.  HENT:  Negative for ear pain and sore throat.   Eyes:  Negative for pain and visual disturbance.  Respiratory:  Negative for cough and shortness of breath.   Cardiovascular:  Negative for chest pain and palpitations.  Gastrointestinal:  Negative for abdominal pain  and vomiting.  Genitourinary:  Negative for dysuria and hematuria.  Musculoskeletal:  Negative for arthralgias and back pain.  Skin:  Positive for wound. Negative for color change and rash.  Neurological:  Negative for seizures and syncope.  All other systems reviewed and are negative.    Physical Exam Triage Vital Signs ED Triage Vitals  Encounter Vitals Group     BP 08/25/23 0839 (!) 158/82     Girls Systolic BP Percentile --      Girls Diastolic BP Percentile --      Boys Systolic BP Percentile --      Boys Diastolic BP Percentile --      Pulse Rate 08/25/23 0839 82     Resp 08/25/23 0839 20     Temp 08/25/23 0839 98.1 F (36.7 C)     Temp Source 08/25/23 0839 Oral     SpO2 08/25/23 0839 97 %     Weight --      Height --       Head Circumference --      Peak Flow --      Pain Score 08/25/23 0835 6     Pain Loc --      Pain Education --      Exclude from Growth Chart --    No data found.  Updated Vital Signs BP (!) 158/82 (BP Location: Left Arm)   Pulse 82   Temp 98.1 F (36.7 C) (Oral)   Resp 20   SpO2 97%   Visual Acuity Right Eye Distance:   Left Eye Distance:   Bilateral Distance:    Right Eye Near:   Left Eye Near:    Bilateral Near:     Physical Exam Vitals and nursing note reviewed.  Constitutional:      General: She is not in acute distress.    Appearance: She is well-developed.  HENT:     Head: Normocephalic and atraumatic.  Eyes:     Conjunctiva/sclera: Conjunctivae normal.  Cardiovascular:     Rate and Rhythm: Normal rate.     Pulses:          Dorsalis pedis pulses are 2+ on the right side.       Posterior tibial pulses are 2+ on the right side.     Heart sounds: No murmur heard. Pulmonary:     Effort: Pulmonary effort is normal. No respiratory distress.  Musculoskeletal:        General: No swelling.     Cervical back: Neck supple.  Skin:    General: Skin is warm and dry.     Capillary Refill: Capillary refill takes less than 2 seconds.     Comments: Right heel with wound that is healing appropriately, some inflammatory changes are present, no signs of abscess or severe infection  Neurological:     Mental Status: She is alert.  Psychiatric:        Mood and Affect: Mood normal.      UC Treatments / Results  Labs (all labs ordered are listed, but only abnormal results are displayed) Labs Reviewed - No data to display  EKG   Radiology No results found.  Procedures Procedures (including critical care time)  Medications Ordered in UC Medications - No data to display  Initial Impression / Assessment and Plan / UC Course  I have reviewed the triage vital signs and the nursing notes.  Pertinent labs & imaging results that were available during my care of the  patient  were reviewed by me and considered in my medical decision making (see chart for details).     Laceration of right heel, subsequent encounter  Visit for wound check   Right heel wound is healing appropriately.  Redness is secondary to normal healing and some inflammation.  She has good palpable pedal pulses and good perfusion.  Recommend finishing antibiotics.  Will prescribe mupirocin  antibiotic ointment to apply 2-3 times daily until the area is healed.  Do not apply alcohol or peroxide to the wound.  Continue to wash the wound with soap and water twice daily.  Follow-up with urgent care as needed.  Final Clinical Impressions(s) / UC Diagnoses   Final diagnoses:  Laceration of right heel, subsequent encounter  Visit for wound check     Discharge Instructions      Right heel wound is healing appropriately.  Redness is secondary to normal healing and some inflammation.  Recommend finishing antibiotics.  Will prescribe mupirocin  antibiotic ointment to apply 2-3 times daily until the area is healed.  Do not apply alcohol or peroxide to the wound.  Continue to wash the wound with soap and water twice daily.  Follow-up with urgent care as needed.    ED Prescriptions     Medication Sig Dispense Auth. Provider   mupirocin  ointment (BACTROBAN ) 2 % Apply 1 Application topically 2 (two) times daily. 22 g Teresa Almarie LABOR, [place]      PDMP not reviewed this encounter.   Teresa Almarie LABOR, [place] 08/25/23 [phone]

## 2023-10-12 ENCOUNTER — Other Ambulatory Visit: Payer: Self-pay | Admitting: Registered Nurse

## 2023-10-12 DIAGNOSIS — Z1231 Encounter for screening mammogram for malignant neoplasm of breast: Secondary | ICD-10-CM

## 2023-11-07 ENCOUNTER — Ambulatory Visit
Admission: RE | Admit: 2023-11-07 | Discharge: 2023-11-07 | Disposition: A | Source: Ambulatory Visit | Attending: Registered Nurse | Admitting: Registered Nurse

## 2023-11-07 DIAGNOSIS — Z1231 Encounter for screening mammogram for malignant neoplasm of breast: Secondary | ICD-10-CM

## 2023-12-06 ENCOUNTER — Ambulatory Visit
Admission: EM | Admit: 2023-12-06 | Discharge: 2023-12-06 | Disposition: A | Discharge status: Home / Self Care | Attending: Family Medicine | Admitting: Family Medicine

## 2023-12-06 DIAGNOSIS — E785 Hyperlipidemia, unspecified: Secondary | ICD-10-CM | POA: Insufficient documentation

## 2023-12-06 DIAGNOSIS — N182 Chronic kidney disease, stage 2 (mild): Secondary | ICD-10-CM | POA: Insufficient documentation

## 2023-12-06 DIAGNOSIS — E7800 Pure hypercholesterolemia, unspecified: Secondary | ICD-10-CM | POA: Insufficient documentation

## 2023-12-06 DIAGNOSIS — F419 Anxiety disorder, unspecified: Secondary | ICD-10-CM | POA: Insufficient documentation

## 2023-12-06 DIAGNOSIS — Z720 Tobacco use: Secondary | ICD-10-CM | POA: Insufficient documentation

## 2023-12-06 DIAGNOSIS — I10 Essential (primary) hypertension: Secondary | ICD-10-CM | POA: Insufficient documentation

## 2023-12-06 DIAGNOSIS — E039 Hypothyroidism, unspecified: Secondary | ICD-10-CM | POA: Insufficient documentation

## 2023-12-06 DIAGNOSIS — F17200 Nicotine dependence, unspecified, uncomplicated: Secondary | ICD-10-CM | POA: Insufficient documentation

## 2023-12-06 DIAGNOSIS — N3090 Cystitis, unspecified without hematuria: Secondary | ICD-10-CM | POA: Insufficient documentation

## 2023-12-06 DIAGNOSIS — E559 Vitamin D deficiency, unspecified: Secondary | ICD-10-CM | POA: Insufficient documentation

## 2023-12-06 DIAGNOSIS — K5792 Diverticulitis of intestine, part unspecified, without perforation or abscess without bleeding: Secondary | ICD-10-CM | POA: Insufficient documentation

## 2023-12-06 DIAGNOSIS — E890 Postprocedural hypothyroidism: Secondary | ICD-10-CM | POA: Insufficient documentation

## 2023-12-06 LAB — POCT URINE DIPSTICK
Bilirubin, UA: NEGATIVE
Glucose, UA: NEGATIVE mg/dL
Ketones, POC UA: NEGATIVE mg/dL
Nitrite, UA: POSITIVE — AB
POC PROTEIN,UA: NEGATIVE
Spec Grav, UA: 1.015 (ref 1.010–1.025)
Urobilinogen, UA: 0.2 U/dL
pH, UA: 5.5 (ref 5.0–8.0)

## 2023-12-06 MED ORDER — PHENAZOPYRIDINE HCL 100 MG PO TABS
100.0000 mg | ORAL_TABLET | Freq: Three times a day (TID) | ORAL | 0 refills | Status: AC | PRN
Start: 2023-12-06 — End: ?

## 2023-12-06 MED ORDER — CIPROFLOXACIN HCL 250 MG PO TABS: 250.0000 mg | ORAL_TABLET | Freq: Two times a day (BID) | ORAL | 0 refills | Status: AC

## 2023-12-06 NOTE — ED Provider Notes (Signed)
 [facility]    CSN: [id number] Arrival date & time: 12/06/23  1518      History   Chief Complaint Chief Complaint  Patient presents with   UTI Symptoms    HPI Patricia Haney is a 78 y.o. female.   HPI  Here for pain with urination and incomplete bladder emptying and urinary frequency.  Symptoms began about 2 days ago.  No nausea or vomiting or fever or chills.  She states that when she has urine infections at her primary prescribes something like Cipro  and it works really well for her.  She request that prescription today.  She is allergic to Augmentin  which causes a rash and erythromycin causes nausea.  Allergy list also includes levothyroxine Past Medical History:  Diagnosis Date   Diverticulitis    High cholesterol    Hypothyroid     Patient Active Problem List   Diagnosis Date Noted   Smoker 12/06/2023   Stage 2 chronic kidney disease 12/06/2023   Pure hypercholesterolemia 12/06/2023   Postoperative hypothyroidism 12/06/2023   Hypothyroidism 12/06/2023   Essential hypertension 12/06/2023   Hyperlipidemia 12/06/2023   Diverticulitis 12/06/2023   Anxiety 12/06/2023   Vitamin D deficiency 12/06/2023   Tobacco user 12/06/2023    Past Surgical History:  Procedure Laterality Date   CATARACT EXTRACTION Right 10/2021   HEMORRHOID SURGERY      OB History   No obstetric history on file.      Home Medications    Prior to Admission medications   Medication Sig Start Date End Date Taking? Authorizing Provider  atorvastatin (LIPITOR) 20 MG tablet Take 20 mg by mouth daily.   Yes [provider]  betamethasone valerate (VALISONE) 0.1 % cream Apply 1 Application topically daily. 03/19/23  Yes [provider]  cetirizine (ZYRTEC ALLERGY) 10 MG tablet Take 10 mg by mouth daily. 03/19/23  Yes [provider]  Cholecalciferol (VITAMIN D) 50 MCG (2000 UT) CAPS Take 1 capsule by mouth daily.   Yes [provider]  ciprofloxacin  (CIPRO ) 250 MG tablet Take 1 tablet (250 mg total) by mouth 2 (two) times daily for 7 days. 12/06/23 12/13/23 Yes Carylon Tamburro K, MD  cyclobenzaprine (FLEXERIL) 5 MG tablet 1 tablet daily Orally 3 times a day; Duration: 7 days As needed 11/15/23  Yes [provider]  levothyroxine (SYNTHROID) 75 MCG tablet Take 75 mcg by mouth daily before breakfast.   Yes [provider]  meloxicam (MOBIC) 15 MG tablet Take 1 capsule by mouth daily. 11/06/23  Yes [provider]  phenazopyridine (PYRIDIUM) 100 MG tablet Take 1 tablet (100 mg total) by mouth 3 (three) times daily as needed (urinary pain). 12/06/23  Yes Vonna Sharlet POUR, MD  acetaminophen  (TYLENOL ) 500 MG tablet Take 1 tablet (500 mg total) by mouth every 6 (six) hours as needed. 01/05/22   Nivia Colon, PA-C  hydrOXYzine  (ATARAX ) 25 MG tablet Take 1 tablet (25 mg total) by mouth every 6 (six) hours. 03/12/23   Odell Balls, PA-C    Family History Family History  Problem Relation Age of Onset   CVA Mother    CVA Father    Heart disease Sister    Cancer Brother     Social History Social History   Tobacco Use   Smoking status: Every Day    Current packs/day: 0.50    Average packs/day: 0.5 packs/day for 16.0 years (8.0 ttl pk-yrs)    Types: Cigarettes   Smokeless tobacco: Never  Vaping Use   Vaping status: Never Used  Substance Use Topics   Alcohol use: Yes    Comment: occ   Drug use: Never     Allergies   Erythromycin, Levothyroxine sodium, and Amoxicillin -pot clavulanate   Review of Systems Review of Systems   Physical Exam Triage Vital Signs ED Triage Vitals  Encounter Vitals Group     BP 12/06/23 1541 (!) 170/78     Girls Systolic BP Percentile --      Girls Diastolic BP Percentile --      Boys Systolic BP Percentile --      Boys Diastolic BP Percentile --      Pulse Rate 12/06/23 1541 91     Resp 12/06/23 1541 20     Temp 12/06/23 1541 98.3 F (36.8 C)      Temp Source 12/06/23 1541 Oral     SpO2 12/06/23 1541 96 %     Weight 12/06/23 1538 134 lb 0.6 oz (60.8 kg)     Height 12/06/23 1538 5' 4 (1.626 m)     Head Circumference --      Peak Flow --      Pain Score 12/06/23 1538 0     Pain Loc --      Pain Education --      Exclude from Growth Chart --    No data found.  Updated Vital Signs BP (!) 170/78 (BP Location: Left Arm)   Pulse 91   Temp 98.3 F (36.8 C) (Oral)   Resp 20   Ht 5' 4 (1.626 m)   Wt 60.8 kg   SpO2 96%   BMI 23.01 kg/m   Visual Acuity Right Eye Distance:   Left Eye Distance:   Bilateral Distance:    Right Eye Near:   Left Eye Near:    Bilateral Near:     Physical Exam Vitals reviewed.  Constitutional:      General: She is not in acute distress.    Appearance: She is not ill-appearing, toxic-appearing or diaphoretic.  HENT:     Mouth/Throat:     Mouth: Mucous membranes are moist.  Eyes:     Extraocular Movements: Extraocular movements intact.     Conjunctiva/sclera: Conjunctivae normal.     Pupils: Pupils are equal, round, and reactive to light.  Cardiovascular:     Rate and Rhythm: Normal rate and regular rhythm.     Heart sounds: No murmur heard. Pulmonary:     Effort: Pulmonary effort is normal.     Breath sounds: Normal breath sounds.  Abdominal:     Palpations: Abdomen is soft.     Tenderness: There is no abdominal tenderness. There is no right CVA tenderness or left CVA tenderness.  Musculoskeletal:     Cervical back: Neck supple.  Lymphadenopathy:     Cervical: No cervical adenopathy.  Skin:    Coloration: Skin is not pale.  Neurological:     General: No focal deficit present.     Mental Status: She is alert and oriented to person, place, and time.  Psychiatric:        Behavior: Behavior normal.      UC Treatments / Results  Labs (all labs ordered are listed, but only abnormal results are displayed) Labs Reviewed  POCT URINE DIPSTICK - Abnormal; Notable for the following  components:      Result Value   Clarity, UA cloudy (*)    Blood, UA small (*)    Nitrite, UA  Positive (*)    Leukocytes, UA Large (3+) (*)    All other components within normal limits  URINE CULTURE    EKG   Radiology No results found.  Procedures Procedures (including critical care time)  Medications Ordered in UC Medications - No data to display  Initial Impression / Assessment and Plan / UC Course  I have reviewed the triage vital signs and the nursing notes.  Pertinent labs & imaging results that were available during my care of the patient were reviewed by me and considered in my medical decision making (see chart for details).     Urinalysis is abnormal with a large amount of leukocytes, 3+ nitrites, and a small amount of blood.  Cipro  sent in for the UTI.  Her last eGFR was normal and she does not take any blood thinners.  Pyridium sent in for the symptoms  Urine culture is sent we will notify her if it looks like the antibiotic needs to be changed  Final Clinical Impressions(s) / UC Diagnoses   Final diagnoses:  Cystitis     Discharge Instructions      The urinalysis shows some white blood cells and nitrites and red blood cells, consistent with a bladder infection.  Take Cipro  250 mg--1 tablet 2 times daily for 7 days  Take Pyridium/phenazopyridine 100 mg--1 tablet 3 times daily as needed for urinary pain.  This medication usually makes the urine orange  Make sure you are drinking plenty of fluids  Urine culture is sent, and staff will notify you that it looks like the antibiotic needs to be changed.      ED Prescriptions     Medication Sig Dispense Auth. Provider   phenazopyridine (PYRIDIUM) 100 MG tablet Take 1 tablet (100 mg total) by mouth 3 (three) times daily as needed (urinary pain). 10 tablet Vonna Sharlet POUR, MD   ciprofloxacin  (CIPRO ) 250 MG tablet Take 1 tablet (250 mg total) by mouth 2 (two) times daily for 7 days. 14 tablet  Jenesys Casseus K, MD      PDMP not reviewed this encounter.   Vonna Sharlet POUR, MD 12/06/23 [phone]

## 2023-12-06 NOTE — ED Triage Notes (Signed)
 Symptoms began a few days ago with painful urination and now pretty cloudy. History of UTI's. No fever. No nausea or vomiting.

## 2023-12-06 NOTE — Discharge Instructions (Signed)
 The urinalysis shows some white blood cells and nitrites and red blood cells, consistent with a bladder infection.  Take Cipro  250 mg--1 tablet 2 times daily for 7 days  Take Pyridium/phenazopyridine 100 mg--1 tablet 3 times daily as needed for urinary pain.  This medication usually makes the urine orange  Make sure you are drinking plenty of fluids  Urine culture is sent, and staff will notify you that it looks like the antibiotic needs to be changed.

## 2023-12-08 ENCOUNTER — Ambulatory Visit (HOSPITAL_COMMUNITY): Payer: Self-pay | Admitting: Family Medicine

## 2023-12-08 LAB — URINE CULTURE: Culture: >=100000 — AB

## 2024-01-30 ENCOUNTER — Encounter: Payer: Self-pay | Admitting: Cardiovascular Disease
